# Patient Record
Sex: Female | Born: 1990 | Race: Black or African American | Hispanic: No | Marital: Single | Smoking: Former smoker
Health system: Southern US, Community
[De-identification: ages and names within clinical notes are randomized; demographics above are authoritative.]

## PROBLEM LIST (undated history)

## (undated) ENCOUNTER — Inpatient Hospital Stay (HOSPITAL_COMMUNITY): Payer: Self-pay

## (undated) DIAGNOSIS — R87629 Unspecified abnormal cytological findings in specimens from vagina: Secondary | ICD-10-CM

## (undated) DIAGNOSIS — IMO0002 Reserved for concepts with insufficient information to code with codable children: Secondary | ICD-10-CM

## (undated) DIAGNOSIS — L089 Local infection of the skin and subcutaneous tissue, unspecified: Secondary | ICD-10-CM

## (undated) HISTORY — DX: Unspecified abnormal cytological findings in specimens from vagina: R87.629

---

## 2008-02-24 DIAGNOSIS — IMO0002 Reserved for concepts with insufficient information to code with codable children: Secondary | ICD-10-CM

## 2008-02-24 DIAGNOSIS — R87619 Unspecified abnormal cytological findings in specimens from cervix uteri: Secondary | ICD-10-CM

## 2008-02-24 HISTORY — DX: Reserved for concepts with insufficient information to code with codable children: IMO0002

## 2008-02-24 HISTORY — DX: Unspecified abnormal cytological findings in specimens from cervix uteri: R87.619

## 2010-06-26 ENCOUNTER — Emergency Department (HOSPITAL_COMMUNITY)
Admission: EM | Admit: 2010-06-26 | Discharge: 2010-06-26 | Disposition: A | Discharge status: Home / Self Care | Payer: Self-pay | Attending: Emergency Medicine | Admitting: Emergency Medicine

## 2010-06-26 DIAGNOSIS — B86 Scabies: Secondary | ICD-10-CM | POA: Insufficient documentation

## 2010-06-26 DIAGNOSIS — R21 Rash and other nonspecific skin eruption: Secondary | ICD-10-CM | POA: Insufficient documentation

## 2011-01-01 ENCOUNTER — Emergency Department (INDEPENDENT_AMBULATORY_CARE_PROVIDER_SITE_OTHER)
Admission: EM | Admit: 2011-01-01 | Discharge: 2011-01-01 | Disposition: A | Discharge status: Home / Self Care | Payer: Self-pay | Source: Home / Self Care | Attending: Family Medicine | Admitting: Family Medicine

## 2011-01-01 ENCOUNTER — Encounter: Payer: Self-pay | Admitting: Emergency Medicine

## 2011-01-01 DIAGNOSIS — J069 Acute upper respiratory infection, unspecified: Secondary | ICD-10-CM

## 2011-01-01 DIAGNOSIS — R05 Cough: Secondary | ICD-10-CM

## 2011-01-01 MED ORDER — GUAIFENESIN-CODEINE 100-10 MG/5ML PO SYRP: 5.0000 mL | ORAL_SOLUTION | Freq: Three times a day (TID) | ORAL | Status: AC | PRN

## 2011-01-01 MED ORDER — AZITHROMYCIN 250 MG PO TABS: 250.0000 mg | ORAL_TABLET | Freq: Every day | ORAL | Status: AC

## 2011-01-01 NOTE — ED Provider Notes (Signed)
History     CSN: [id number] Arrival date & time: 01/01/2011  8:58 AM   First MD Initiated Contact with Patient 01/01/11 1006      Chief Complaint  Patient presents with  . Cough    (Consider location/radiation/quality/duration/timing/severity/associated sxs/prior treatment) Patient is a 20 y.o. female presenting with cough. The history is provided by the patient.  Cough This is a new problem. The current episode started more than 1 week ago. The problem occurs constantly. The cough is productive of sputum. There has been no fever. Associated symptoms include headaches and rhinorrhea. Pertinent negatives include no chills, no sweats, no ear congestion, no ear pain, no sore throat, no myalgias, no shortness of breath and no wheezing. She has tried decongestants for the symptoms. She is not a smoker.    History reviewed. No pertinent past medical history.  History reviewed. No pertinent past surgical history.  History reviewed. No pertinent family history.  History  Substance Use Topics  . Smoking status: Former Smoker    Quit date: 12/25/2010  . Smokeless tobacco: Not on file  . Alcohol Use: Yes    OB History    Grav Para Term Preterm Abortions TAB SAB Ect Mult Living                  Review of Systems  Constitutional: Positive for fatigue. Negative for fever and chills.  HENT: Positive for congestion, rhinorrhea and neck pain. Negative for ear pain, nosebleeds, sore throat and neck stiffness.   Eyes: Negative.   Respiratory: Positive for cough. Negative for shortness of breath and wheezing.   Cardiovascular: Negative.   Gastrointestinal: Negative.   Genitourinary: Negative.   Musculoskeletal: Negative for myalgias.  Skin: Negative.   Neurological: Positive for headaches.    Allergies  Review of patient's allergies indicates no known allergies.  Home Medications   Current Outpatient Rx  Name Route Sig Dispense Refill  . TYLENOL COLD/FLU SEVERE PO Oral Take by  mouth.      . NYQUIL PO Oral Take by mouth.        BP 114/78  Pulse 74  Temp(Src) 98.5 F (36.9 C) (Oral)  Resp 16  SpO2 99%  LMP 12/09/2010  Physical Exam  Constitutional: She appears well-developed and well-nourished.  HENT:  Head: Normocephalic and atraumatic.  Right Ear: Tympanic membrane normal.  Left Ear: Tympanic membrane normal.  Nose: Nose normal.  Mouth/Throat: Uvula is midline and mucous membranes are normal. No oropharyngeal exudate or posterior oropharyngeal erythema.  Cardiovascular: Normal rate and regular rhythm.   Pulmonary/Chest: Effort normal and breath sounds normal.    ED Course  Procedures (including critical care time)  Labs Reviewed - No data to display No results found.   No diagnosis found.    MDM          Richardo Priest, MD 01/01/11 1026

## 2011-01-01 NOTE — ED Notes (Signed)
Cough and congestion for more than a week.  Feels congestion in throat and chest with cough, denies fever.  Phlegm is white.  Difficult to cough phlegm out

## 2011-03-20 ENCOUNTER — Inpatient Hospital Stay (HOSPITAL_COMMUNITY)
Admission: AD | Admit: 2011-03-20 | Discharge: 2011-03-20 | Disposition: A | Discharge status: Home / Self Care | Payer: Self-pay | Source: Ambulatory Visit | Attending: Family Medicine | Admitting: Family Medicine

## 2011-03-20 ENCOUNTER — Emergency Department (HOSPITAL_COMMUNITY)
Admission: EM | Admit: 2011-03-20 | Discharge: 2011-03-20 | Disposition: A | Discharge status: Home / Self Care | Payer: Self-pay | Attending: Emergency Medicine | Admitting: Emergency Medicine

## 2011-03-20 ENCOUNTER — Encounter (HOSPITAL_COMMUNITY): Payer: Self-pay | Admitting: *Deleted

## 2011-03-20 ENCOUNTER — Encounter (HOSPITAL_COMMUNITY): Payer: Self-pay

## 2011-03-20 DIAGNOSIS — L02219 Cutaneous abscess of trunk, unspecified: Secondary | ICD-10-CM | POA: Insufficient documentation

## 2011-03-20 DIAGNOSIS — R10819 Abdominal tenderness, unspecified site: Secondary | ICD-10-CM | POA: Insufficient documentation

## 2011-03-20 DIAGNOSIS — L02214 Cutaneous abscess of groin: Secondary | ICD-10-CM

## 2011-03-20 DIAGNOSIS — L02419 Cutaneous abscess of limb, unspecified: Secondary | ICD-10-CM | POA: Insufficient documentation

## 2011-03-20 DIAGNOSIS — L03319 Cellulitis of trunk, unspecified: Secondary | ICD-10-CM | POA: Insufficient documentation

## 2011-03-20 DIAGNOSIS — L0291 Cutaneous abscess, unspecified: Secondary | ICD-10-CM

## 2011-03-20 HISTORY — DX: Reserved for concepts with insufficient information to code with codable children: IMO0002

## 2011-03-20 LAB — CBC
MCH: 28.3 pg (ref 26.0–34.0)
MCHC: 32 g/dL (ref 30.0–36.0)
MCV: 88.4 fL (ref 78.0–100.0)
Platelets: 284 10*3/uL (ref 150–400)
RBC: 4.14 MIL/uL (ref 3.87–5.11)

## 2011-03-20 MED ORDER — DOXYCYCLINE HYCLATE 100 MG PO CAPS: 100.0000 mg | ORAL_CAPSULE | Freq: Two times a day (BID) | ORAL | Status: AC

## 2011-03-20 MED ORDER — IBUPROFEN 800 MG PO TABS: 800.0000 mg | ORAL_TABLET | Freq: Three times a day (TID) | ORAL | Status: AC

## 2011-03-20 MED ORDER — KETOROLAC TROMETHAMINE 60 MG/2ML IM SOLN
60.0000 mg | Freq: Once | INTRAMUSCULAR | Status: AC
  Administered 2011-03-20: 60 mg via INTRAMUSCULAR
  Filled 2011-03-20: qty 2

## 2011-03-20 MED ORDER — HYDROCODONE-ACETAMINOPHEN 5-325 MG PO TABS: 2.0000 | ORAL_TABLET | ORAL | Status: AC | PRN

## 2011-03-20 NOTE — ED Notes (Signed)
[facility] in [facility]. Pt states [facility] told her she had an [facility] and sent her to ed to have follow up

## 2011-03-20 NOTE — Progress Notes (Signed)
Patient states she shaved last week and on 1-23 started having pain and soreness on the right thigh and in the groin area. Concerned about an infection.

## 2011-03-20 NOTE — Consult Note (Signed)
Consulted with Dr. Weldon Inches regarding pt HPI/exam>lesion needs I&D send to [facility]

## 2011-03-20 NOTE — ED Notes (Signed)
Pt states "I've been @ the [facility] since noon, they led me to believe everyone here was waiting on me, I think what caused it was I shaved"; pt indicates she has an abscess to right groin

## 2011-03-20 NOTE — ED Provider Notes (Signed)
Chart reviewed and agree with management and plan.  

## 2011-03-20 NOTE — ED Provider Notes (Signed)
History     No chief complaint on file.  HPI  Patient states she shaved last week and on 1-23 started having pain and soreness on the right thigh and in the groin area. Concerned about an infection.  Denies fever, body aches, or chills.   Past Medical History  Diagnosis Date  . Abnormal Pap smear 2010    cervical biopsy    Past Surgical History  Procedure Date  . No past surgeries     Family History  Problem Relation Age of Onset  . Anesthesia problems Neg Hx     History  Substance Use Topics  . Smoking status: Former Smoker    Quit date: 12/25/2010  . Smokeless tobacco: Not on file  . Alcohol Use: Yes    Allergies: No Known Allergies  Prescriptions prior to admission  Medication Sig Dispense Refill  . acetaminophen (TYLENOL) 500 MG tablet Take 1,000 mg by mouth every 6 (six) hours as needed. Takes for pain        Review of Systems  Constitutional: Negative.   Skin: Positive for rash ( groin, right thign).   Physical Exam   Blood pressure 142/75, pulse 98, temperature 99.2 F (37.3 C), temperature source Oral, resp. rate 16, height 5\' 2"  (1.575 m), weight 98.793 kg (217 lb 12.8 oz), last menstrual period 03/17/2011, SpO2 99.00%.  Physical Exam  Constitutional: She is oriented to person, place, and time. She appears well-developed and well-nourished. No distress.  HENT:  Head: Normocephalic.  Neck: Normal range of motion. Neck supple.  Cardiovascular: Normal rate, regular rhythm and normal heart sounds.   Respiratory: Effort normal and breath sounds normal.  GI: Soft.  Lymphadenopathy:       Right: Inguinal adenopathy present.  Neurological: She is alert and oriented to person, place, and time. She has normal reflexes.  Skin: Skin is warm and dry.       MAU Course  Procedures IM Toradol Consult with Dr. Manus Gunning [facility])>send pt for I&D of wound Assessment and Plan  Abscess  Plan: Discharge from [facility]  Instruct pt to go to [facility]  for drainage of wound  [facility] 03/20/2011, 1:48 PM

## 2011-03-20 NOTE — ED Provider Notes (Signed)
History     CSN: [id number]  Arrival date & time 03/20/11  1533   First MD Initiated Contact with Patient 03/20/11 1553      Chief Complaint  Patient presents with  . Abscess    (Consider location/radiation/quality/duration/timing/severity/associated sxs/prior treatment) HPI Comments: Abscess: Patient presents for evaluation of a cutaneous abscess. Lesion is located in the right inguinal region. Onset was 2 days ago. Abscess has gradually become larger. No fevers/chills.  No drainage from the abscess.  Abscess is tender.  Patient does not have previous history of cutaneous abscesses. Patient does not have diabetes. Patient was seen at [facility] just prior to arrival and sent here for incision and drainage.   Patient is a 21 y.o. female presenting with abscess. The history is provided by the patient.  Abscess  This is a new problem. Episode onset: 2-3 days ago. The abscess is characterized by redness and painfulness. Pertinent negatives include no fever and no vomiting.    Past Medical History  Diagnosis Date  . Abnormal Pap smear 2010    cervical biopsy    Past Surgical History  Procedure Date  . No past surgeries     Family History  Problem Relation Age of Onset  . Anesthesia problems Neg Hx     History  Substance Use Topics  . Smoking status: Former Smoker    Quit date: 12/25/2010  . Smokeless tobacco: Not on file  . Alcohol Use: Yes    OB History    Grav Para Term Preterm Abortions TAB SAB Ect Mult Living   0 0 0 0 0 0 0 0 0 0       Review of Systems  Constitutional: Negative for fever and chills.  Gastrointestinal: Negative for vomiting.  Musculoskeletal: Negative for arthralgias.  Skin: Negative for wound.  Hematological: Does not bruise/bleed easily.    Allergies  Review of patient's allergies indicates no known allergies.  Home Medications   Current Outpatient Rx  Name Route Sig Dispense Refill  . ACETAMINOPHEN 500 MG PO TABS Oral Take  1,000 mg by mouth every 6 (six) hours as needed. Takes for pain      BP 136/84  Pulse 90  Temp(Src) 98.7 F (37.1 C) (Oral)  Resp 17  Wt 217 lb (98.431 kg)  SpO2 100%  LMP 03/17/2011  Physical Exam  Nursing note and vitals reviewed. Constitutional: She is oriented to person, place, and time. She appears well-developed and well-nourished. She does not have a sickly appearance. She does not appear ill. No distress.  HENT:  Head: Normocephalic and atraumatic.  Eyes: Conjunctivae and EOM are normal.  Neck: Normal range of motion. Neck supple.  Cardiovascular: Normal rate and regular rhythm.   Pulmonary/Chest: Effort normal and breath sounds normal.  Musculoskeletal: She exhibits no edema.  Lymphadenopathy:       Right: No inguinal adenopathy present.       Left: No inguinal adenopathy present.  Neurological: She is alert and oriented to person, place, and time.  Skin: Skin is warm and dry. No rash noted. She is not diaphoretic.       3 cm sized abscess located on right inguinal area Tenderness to palpation. Not currently draining. Abscess is fluctuant.  Mild surrounding erythema and induration.    ED Course  Procedures (including critical care time)  Labs Reviewed - No data to display No results found.   No diagnosis found.  INCISION AND DRAINAGE Performed by: Anne Shutter, Loistine Eberlin Consent: Verbal consent obtained.  Risks and benefits: risks, benefits and alternatives were discussed Type: abscess  Body area: right inguinal area  Anesthesia: local infiltration  Local anesthetic: lidocaine 2% with epinephrine  Anesthetic total: 2 ml  Complexity: complex Blunt dissection to break up loculations  Drainage: purulent  Drainage amount: large  Packing material: 1/4 in iodoform gauze  Patient tolerance: Patient tolerated the procedure well with no immediate complications.    MDM  Patient with skin abscess amenable to incision and drainage.  Abscess was large enough  to warrant packing with removal and wound recheck in 2 days.  Mild surrounding erythema and induration.  Will d/c to home.  Patient put on a course of Doxycycline and given short course of pain medication.        Pascal Lux [place], PA-C 03/20/11 2314

## 2011-03-20 NOTE — Consult Note (Signed)
  Medical screening examination/treatment/procedure(s) were performed by non-physician practitioner and as supervising physician I was immediately available for consultation/collaboration.     

## 2011-03-22 NOTE — ED Provider Notes (Signed)
Medical screening examination/treatment/procedure(s) were performed by non-physician practitioner and as supervising physician I was immediately available for consultation/collaboration.    Lucillie Kiesel L Justo Hengel, MD 03/22/11 2251 

## 2013-10-23 ENCOUNTER — Encounter (HOSPITAL_COMMUNITY): Payer: Self-pay | Admitting: Emergency Medicine

## 2013-10-23 ENCOUNTER — Emergency Department (HOSPITAL_COMMUNITY)
Admission: EM | Admit: 2013-10-23 | Discharge: 2013-10-23 | Disposition: A | Discharge status: Home / Self Care | Payer: Self-pay | Attending: Emergency Medicine | Admitting: Emergency Medicine

## 2013-10-23 DIAGNOSIS — J069 Acute upper respiratory infection, unspecified: Secondary | ICD-10-CM | POA: Insufficient documentation

## 2013-10-23 DIAGNOSIS — Z87891 Personal history of nicotine dependence: Secondary | ICD-10-CM | POA: Insufficient documentation

## 2013-10-23 DIAGNOSIS — J029 Acute pharyngitis, unspecified: Secondary | ICD-10-CM | POA: Insufficient documentation

## 2013-10-23 LAB — RAPID STREP SCREEN (MED CTR MEBANE ONLY): STREPTOCOCCUS, GROUP A SCREEN (DIRECT): NEGATIVE

## 2013-10-23 MED ORDER — BENZONATATE 100 MG PO CAPS: 200.0000 mg | ORAL_CAPSULE | Freq: Two times a day (BID) | ORAL | Status: DC | PRN

## 2013-10-23 NOTE — ED Notes (Signed)
Declined W/C at D/C and was escorted to lobby by RN. 

## 2013-10-23 NOTE — ED Notes (Signed)
Pt states "I don't even want my chest x-ray, I've been sitting here for over an hour and I just want whatever medications I can get and leave"

## 2013-10-23 NOTE — Discharge Instructions (Signed)
Return to the emergency room with worsening of symptoms, new symptoms or with symptoms that are concerning, difficulty breathing, coughing up thick colored material, or persistent symptoms.   Upper Respiratory Infection, Adult An upper respiratory infection (URI) is also sometimes known as the common cold. The upper respiratory tract includes the nose, sinuses, throat, trachea, and bronchi. Bronchi are the airways leading to the lungs. Most people improve within 1 week, but symptoms can last up to 2 weeks. A residual cough may last even longer.  CAUSES Many different viruses can infect the tissues lining the upper respiratory tract. The tissues become irritated and inflamed and often become very moist. Mucus production is also common. A cold is contagious. You can easily spread the virus to others by oral contact. This includes kissing, sharing a glass, coughing, or sneezing. Touching your mouth or nose and then touching a surface, which is then touched by another person, can also spread the virus. SYMPTOMS  Symptoms typically develop 1 to 3 days after you come in contact with a cold virus. Symptoms vary from person to person. They may include:  Runny nose.  Sneezing.  Nasal congestion.  Sinus irritation.  Sore throat.  Loss of voice (laryngitis).  Cough.  Fatigue.  Muscle aches.  Loss of appetite.  Headache.  Low-grade fever. DIAGNOSIS  You might diagnose your own cold based on familiar symptoms, since most people get a cold 2 to 3 times a year. Your caregiver can confirm this based on your exam. Most importantly, your caregiver can check that your symptoms are not due to another disease such as strep throat, sinusitis, pneumonia, asthma, or epiglottitis. Blood tests, throat tests, and X-rays are not necessary to diagnose a common cold, but they may sometimes be helpful in excluding other more serious diseases. Your caregiver will decide if any further tests are required. RISKS  AND COMPLICATIONS  You may be at risk for a more severe case of the common cold if you smoke cigarettes, have chronic heart disease (such as heart failure) or lung disease (such as asthma), or if you have a weakened immune system. The very young and very old are also at risk for more serious infections. Bacterial sinusitis, middle ear infections, and bacterial pneumonia can complicate the common cold. The common cold can worsen asthma and chronic obstructive pulmonary disease (COPD). Sometimes, these complications can require emergency medical care and may be life-threatening. PREVENTION  The best way to protect against getting a cold is to practice good hygiene. Avoid oral or hand contact with people with cold symptoms. Wash your hands often if contact occurs. There is no clear evidence that vitamin C, vitamin E, echinacea, or exercise reduces the chance of developing a cold. However, it is always recommended to get plenty of rest and practice good nutrition. TREATMENT  Treatment is directed at relieving symptoms. There is no cure. Antibiotics are not effective, because the infection is caused by a virus, not by bacteria. Treatment may include:  Increased fluid intake. Sports drinks offer valuable electrolytes, sugars, and fluids.  Breathing heated mist or steam (vaporizer or shower).  Eating chicken soup or other clear broths, and maintaining good nutrition.  Getting plenty of rest.  Using gargles or lozenges for comfort.  Controlling fevers with ibuprofen or acetaminophen as directed by your caregiver.  Increasing usage of your inhaler if you have asthma. Zinc gel and zinc lozenges, taken in the first 24 hours of the common cold, can shorten the duration and lessen the  severity of symptoms. Pain medicines may help with fever, muscle aches, and throat pain. A variety of non-prescription medicines are available to treat congestion and runny nose. Your caregiver can make recommendations and may  suggest nasal or lung inhalers for other symptoms.  HOME CARE INSTRUCTIONS   Only take over-the-counter or prescription medicines for pain, discomfort, or fever as directed by your caregiver.  Use a warm mist humidifier or inhale steam from a shower to increase air moisture. This may keep secretions moist and make it easier to breathe.  Drink enough water and fluids to keep your urine clear or pale yellow.  Rest as needed.  Return to work when your temperature has returned to normal or as your caregiver advises. You may need to stay home longer to avoid infecting others. You can also use a face mask and careful hand washing to prevent spread of the virus. SEEK MEDICAL CARE IF:   After the first few days, you feel you are getting worse rather than better.  You need your caregiver's advice about medicines to control symptoms.  You develop chills, worsening shortness of breath, or brown or red sputum. These may be signs of pneumonia.  You develop yellow or brown nasal discharge or pain in the face, especially when you bend forward. These may be signs of sinusitis.  You develop a fever, swollen neck glands, pain with swallowing, or white areas in the back of your throat. These may be signs of strep throat. SEEK IMMEDIATE MEDICAL CARE IF:   You have a fever.  You develop severe or persistent headache, ear pain, sinus pain, or chest pain.  You develop wheezing, a prolonged cough, cough up blood, or have a change in your usual mucus (if you have chronic lung disease).  You develop sore muscles or a stiff neck. Document Released: 08/05/2000 Document Revised: 05/04/2011 Document Reviewed: 05/17/2013 [facility] Patient Information 2015 [employer], [place]. This information is not intended to replace advice given to you by your health care provider. Make sure you discuss any questions you have with your health care provider.

## 2013-10-23 NOTE — ED Provider Notes (Signed)
CSN: [id number]     Arrival date & time 10/23/13  1712 History  This chart was scribed for non-physician practitioner, Louann Sjogren, PA-C working with Gerhard Munch, MD by Greggory Stallion, ED scribe. This patient was seen in room TR07C/TR07C and the patient's care was started at 6:01 PM.    Chief Complaint  Patient presents with  . Sore Throat  . Cough   The history is provided by the patient. No language interpreter was used.   HPI Comments: Rachel Barrera is a 23 y.o. female who presents to the Emergency Department complaining of gradual onset sore throat, cough and intermittent congestion, generalized body aches that started one week ago. She states the cough is productive of yellow/clear mucus that is nonopaque, nonthick in the mornings but becomes nonproductive throughout the day. Reports associated intermittent headaches due to cough. Pt has taken alka seltzer and mucinex with no relief. She has also used chloraseptic with little relief. States this is not similar to her allergies. Denies fever, chills, rhinorrhea, sinus pressure or tenderness, difficulty breathing, SOB. Pt states she started smoking again.   Past Medical History  Diagnosis Date  . Abnormal Pap smear 2010    cervical biopsy   Past Surgical History  Procedure Laterality Date  . No past surgeries     Family History  Problem Relation Age of Onset  . Anesthesia problems Neg Hx    History  Substance Use Topics  . Smoking status: Former Smoker    Quit date: 12/25/2010  . Smokeless tobacco: Not on file  . Alcohol Use: Yes   OB History   Grav Para Term Preterm Abortions TAB SAB Ect Mult Living       Review of Systems  Constitutional: Negative for fever and chills.  HENT: Positive for congestion and sore throat. Negative for rhinorrhea and sinus pressure.   Respiratory: Positive for cough. Negative for shortness of breath.   Neurological: Positive for headaches.  All other systems  reviewed and are negative.  Allergies  Review of patient's allergies indicates no known allergies.  Home Medications   Prior to Admission medications   Medication Sig Start Date End Date Taking? Authorizing Provider  acetaminophen (TYLENOL) 500 MG tablet Take 1,000 mg by mouth every 6 (six) hours as needed. Takes for pain   Yes Historical Provider, MD  benzonatate (TESSALON) 100 MG capsule Take 2 capsules (200 mg total) by mouth 2 (two) times daily as needed for cough. 10/23/13   Louann Sjogren, PA-C   BP 123/79  Pulse 77  Temp(Src) 98.4 F (36.9 C) (Oral)  Resp 12  Wt 206 lb 9 oz (93.696 kg)  SpO2 100%  LMP 10/15/2013  Physical Exam  Nursing note and vitals reviewed. Constitutional: She is oriented to person, place, and time. She appears well-developed and well-nourished. No distress.  HENT:  Head: Normocephalic and atraumatic.  Mouth/Throat: Posterior oropharyngeal erythema present. No oropharyngeal exudate or posterior oropharyngeal edema.  No sinus tenderness.  Eyes: Conjunctivae and EOM are normal.  Neck: Normal range of motion. Neck supple. No tracheal deviation present.  Cardiovascular: Normal rate, regular rhythm and normal heart sounds.   Pulmonary/Chest: Effort normal and breath sounds normal. No respiratory distress. She has no wheezes. She has no rhonchi. She has no rales.  Musculoskeletal: Normal range of motion.  Lymphadenopathy:    She has cervical adenopathy.  Neurological: She is alert and oriented to person, place, and time.  She exhibits normal muscle tone.  Skin: Skin is warm and dry.  Psychiatric: She has a normal mood and affect. Her behavior is normal.    ED Course  Procedures (including critical care time)  DIAGNOSTIC STUDIES: Oxygen Saturation is 99% on RA, normal by my interpretation.    COORDINATION OF CARE: 6:08 PM-Discussed smoking cessation with pt. Discussed treatment plan with pt at bedside and pt agreed to plan.   Labs Review Labs  Reviewed  RAPID STREP SCREEN  CULTURE, GROUP A STREP    Imaging Review No results found.   EKG Interpretation None     Meds given in ED:  Medications - No data to display  Discharge Medication List as of 10/23/2013  6:49 PM    START taking these medications   Details  benzonatate (TESSALON) 100 MG capsule Take 2 capsules (200 mg total) by mouth 2 (two) times daily as needed for cough., Starting 10/23/2013, Until Discontinued, Print          MDM   Final diagnoses:  URI (upper respiratory infection)   Rachel Barrera is a 23 y.o. female complaining of persistent cough, sore throat for one week. She has tried OTC cold meds with minimal relief. VSS. Lungs clear to auscultation and pharynx erythematous.  Rapid strep negative. Patient is a smoker. I doubt PNA due to clear BS, afebrile and nonpurulent sputum. Discussed the typical course of a URI and the prolonged cough that can last for up to two months. Discussed the effect of smoking on the lungs and how it prolongs the course of a URI. Discussed smoking cessation and patient seems agreeable to this. Continue to treat with OTC cold meds and prescribed tessalon. Patient advised to return with increase in sputum and purulence and fevers, difficulty breathing. Patient does not have PCP. Follow up with ED if symptoms worsen.  Discussed return precautions with patient. Discussed all results and patient verbalizes understanding and agrees with plan.  I personally performed the services described in this documentation, which was scribed in my presence. The recorded information has been reviewed and is accurate.  Louann Sjogren, PA-C 10/24/13 1158

## 2013-10-23 NOTE — ED Notes (Signed)
Pt reports sore throat and cough for 1 week. Denies fever. Has been taking OTC meds with no relief. Is a x 4. No respiratory difficulty.

## 2013-10-24 NOTE — ED Provider Notes (Signed)
  Medical screening examination/treatment/procedure(s) were performed by non-physician practitioner and as supervising physician I was immediately available for consultation/collaboration.   EKG Interpretation None         Christabel Camire, MD 10/24/13 1845 

## 2013-10-25 LAB — CULTURE, GROUP A STREP

## 2013-11-12 ENCOUNTER — Encounter (HOSPITAL_COMMUNITY): Payer: Self-pay | Admitting: Emergency Medicine

## 2013-11-12 ENCOUNTER — Emergency Department (HOSPITAL_COMMUNITY)
Admission: EM | Admit: 2013-11-12 | Discharge: 2013-11-12 | Disposition: A | Discharge status: Home / Self Care | Payer: Self-pay | Attending: Emergency Medicine | Admitting: Emergency Medicine

## 2013-11-12 DIAGNOSIS — F17200 Nicotine dependence, unspecified, uncomplicated: Secondary | ICD-10-CM | POA: Insufficient documentation

## 2013-11-12 DIAGNOSIS — N764 Abscess of vulva: Secondary | ICD-10-CM | POA: Insufficient documentation

## 2013-11-12 DIAGNOSIS — Z792 Long term (current) use of antibiotics: Secondary | ICD-10-CM | POA: Insufficient documentation

## 2013-11-12 MED ORDER — CLINDAMYCIN HCL 150 MG PO CAPS: 300.0000 mg | ORAL_CAPSULE | Freq: Four times a day (QID) | ORAL | Status: DC

## 2013-11-12 MED ORDER — SODIUM BICARBONATE 4 % IV SOLN
5.0000 mL | Freq: Once | INTRAVENOUS | Status: AC
  Administered 2013-11-12: 5 mL via SUBCUTANEOUS
  Filled 2013-11-12: qty 5

## 2013-11-12 MED ORDER — OXYCODONE-ACETAMINOPHEN 5-325 MG PO TABS
1.0000 | ORAL_TABLET | Freq: Once | ORAL | Status: AC
  Administered 2013-11-12: 1 via ORAL
  Filled 2013-11-12: qty 1

## 2013-11-12 MED ORDER — LIDOCAINE HCL 1 % IJ SOLN
5.0000 mL | Freq: Once | INTRAMUSCULAR | Status: AC
  Administered 2013-11-12: 5 mL
  Filled 2013-11-12: qty 20

## 2013-11-12 MED ORDER — OXYCODONE-ACETAMINOPHEN 5-325 MG PO TABS: 1.0000 | ORAL_TABLET | Freq: Four times a day (QID) | ORAL | Status: DC | PRN

## 2013-11-12 NOTE — ED Provider Notes (Signed)
CSN: [id number]     Arrival date & time 11/12/13  1135 History   First MD Initiated Contact with Patient 11/12/13 1218     Chief Complaint  Patient presents with  . Abscess    left labia     (Consider location/radiation/quality/duration/timing/severity/associated sxs/prior Treatment) Patient is a 23 y.o. female presenting with abscess. The history is provided by the patient.  Abscess Associated symptoms: no fever    patient's had left labial swelling for the last couple days. His been getting worse. No fevers. She has a history of same after shaving, but states she's not been shaving recently. No chills. No fevers. No vaginal bleeding or discharge.  Past Medical History  Diagnosis Date  . Abnormal Pap smear 2010    cervical biopsy   Past Surgical History  Procedure Laterality Date  . No past surgeries     Family History  Problem Relation Age of Onset  . Anesthesia problems Neg Hx    History  Substance Use Topics  . Smoking status: Current Some Day Smoker    Types: Cigarettes  . Smokeless tobacco: Not on file  . Alcohol Use: Yes   OB History   Grav Para Term Preterm Abortions TAB SAB Ect Mult Living       Review of Systems  Constitutional: Negative for fever and chills.  Gastrointestinal: Negative for abdominal pain.  Genitourinary: Negative for hematuria, vaginal bleeding, vaginal discharge, vaginal pain and pelvic pain.      Allergies  Review of patient's allergies indicates no known allergies.  Home Medications   Prior to Admission medications   Medication Sig Start Date End Date Taking? Authorizing Provider  clindamycin (CLEOCIN) 150 MG capsule Take 2 capsules (300 mg total) by mouth 4 (four) times daily. 11/12/13   Juliet Rude. Shonda Mandarino, MD  oxyCODONE-acetaminophen (PERCOCET/ROXICET) 5-325 MG per tablet Take 1-2 tablets by mouth every 6 (six) hours as needed for severe pain. 11/12/13   Juliet Rude. Deundra Furber, MD   BP 120/66  Pulse 93   Temp(Src) 98.1 F (36.7 C) (Oral)  Resp 18  SpO2 100%  LMP 10/13/2013 Physical Exam  Constitutional: She appears well-developed and well-nourished.  Cardiovascular: Normal rate and regular rhythm.   Pulmonary/Chest: Effort normal.  Abdominal: There is no tenderness.  Genitourinary:  Tender firm area of approximately 3 cm on left labia majora. There is fluctuance. No drainage    ED Course  Procedures (including critical care time) Labs Review Labs Reviewed - No data to display  Imaging Review No results found.   EKG Interpretation None     INCISION AND DRAINAGE Performed by: Billee Cashing. Consent: Verbal consent obtained. Risks and benefits: risks, benefits and alternatives were discussed Type: abscess  Body area: Left labia majora  Anesthesia: local infiltration  Incision was made with a scalpel.  Local anesthetic: lidocaine 1% without epinephrine with bicarbonate   Anesthetic total: 2 ml  Complexity: complex Blunt dissection to break up loculations  Drainage: purulent  Drainage amount: Moderate    Patient tolerance: Patient tolerated the procedure well with no immediate complications.   MDM   Final diagnoses:  Left genital labial abscess    Patient labial abscess. Continued firmness after drainage. We'll treat with antibiotics. Will followup for recheck in 2-3 days.    Juliet Rude. Rubin Payor, MD 11/12/13 1600

## 2013-11-12 NOTE — Discharge Instructions (Signed)
Abscess  An abscess is an infected area that contains a collection of pus and debris.It can occur in almost any part of the body. An abscess is also known as a furuncle or boil.  CAUSES   An abscess occurs when tissue gets infected. This can occur from blockage of oil or sweat glands, infection of hair follicles, or a minor injury to the skin. As the body tries to fight the infection, pus collects in the area and creates pressure under the skin. This pressure causes pain. People with weakened immune systems have difficulty fighting infections and get certain abscesses more often.   SYMPTOMS  Usually an abscess develops on the skin and becomes a painful mass that is red, warm, and tender. If the abscess forms under the skin, you may feel a moveable soft area under the skin. Some abscesses break open (rupture) on their own, but most will continue to get worse without care. The infection can spread deeper into the body and eventually into the bloodstream, causing you to feel ill.   DIAGNOSIS   Your caregiver will take your medical history and perform a physical exam. A sample of fluid may also be taken from the abscess to determine what is causing your infection.  TREATMENT   Your caregiver may prescribe antibiotic medicines to fight the infection. However, taking antibiotics alone usually does not cure an abscess. Your caregiver may need to make a small cut (incision) in the abscess to drain the pus. In some cases, gauze is packed into the abscess to reduce pain and to continue draining the area.  HOME CARE INSTRUCTIONS    Only take over-the-counter or prescription medicines for pain, discomfort, or fever as directed by your caregiver.   If you were prescribed antibiotics, take them as directed. Finish them even if you start to feel better.   If gauze is used, follow your caregiver's directions for changing the gauze.   To avoid spreading the infection:   Keep your draining abscess covered with a  bandage.   Wash your hands well.   Do not share personal care items, towels, or whirlpools with others.   Avoid skin contact with others.   Keep your skin and clothes clean around the abscess.   Keep all follow-up appointments as directed by your caregiver.  SEEK MEDICAL CARE IF:    You have increased pain, swelling, redness, fluid drainage, or bleeding.   You have muscle aches, chills, or a general ill feeling.   You have a fever.  MAKE SURE YOU:    Understand these instructions.   Will watch your condition.   Will get help right away if you are not doing well or get worse.  Document Released: 11/19/2004 Document Revised: 08/11/2011 Document Reviewed: 04/24/2011  ExitCare Patient Information 2015 [employer]. This information is not intended to replace advice given to you by your health care provider. Make sure you discuss any questions you have with your health care provider.  Incision and Drainage  Incision and drainage is a procedure in which a sac-like structure (cystic structure) is opened and drained. The area to be drained usually contains material such as pus, fluid, or blood.   LET YOUR CAREGIVER KNOW ABOUT:    Allergies to medicine.   Medicines taken, including vitamins, herbs, eyedrops, over-the-counter medicines, and creams.   Use of steroids (by mouth or creams).   Previous problems with anesthetics or numbing medicines.   History of bleeding problems or blood clots.     Previous surgery.   Other health problems, including diabetes and kidney problems.   Possibility of pregnancy, if this applies.  RISKS AND COMPLICATIONS   Pain.   Bleeding.   Scarring.   Infection.  BEFORE THE PROCEDURE   You may need to have an ultrasound or other imaging tests to see how large or deep your cystic structure is. Blood tests may also be used to determine if you have an infection or how severe the infection is. You may need to have a tetanus shot.  PROCEDURE   The affected area is cleaned with a  cleaning fluid. The cyst area will then be numbed with a medicine (local anesthetic). A small incision will be made in the cystic structure. A syringe or catheter may be used to drain the contents of the cystic structure, or the contents may be squeezed out. The area will then be flushed with a cleansing solution. After cleansing the area, it is often gently packed with a gauze or another wound dressing. Once it is packed, it will be covered with gauze and tape or some other type of wound dressing.  AFTER THE PROCEDURE    Often, you will be allowed to go home right after the procedure.   You may be given antibiotic medicine to prevent or heal an infection.   If the area was packed with gauze or some other wound dressing, you will likely need to come back in 1 to 2 days to get it removed.   The area should heal in about 14 days.  Document Released: 08/05/2000 Document Revised: 08/11/2011 Document Reviewed: 04/06/2011  Exit[employer] Patient Information 2015 [employer]. This information is not intended to replace advice given to you by your health care provider. Make sure you discuss any questions you have with your health care provider.

## 2013-11-12 NOTE — ED Notes (Signed)
Pt c/o left labia swollen and very sore since Friday when her period started.  Pt states that she had one before but was due to shaving but she hasnt shaved since.  Pt states that with warm compresses hasnt made a head to drain.

## 2014-02-23 NOTE — L&D Delivery Note (Signed)
Delivery Note At 2:19 AM a viable female was delivered via  (Presentation: ;  ).  APGAR: , ; weight  .   Placenta status: , .  Cord:  with the following complications: .  Cord pH: not done  Anesthesia: Epidural  Episiotomy:   Lacerations:   Suture Repair: 2.0 Est. Blood Loss (mL):    Mom to postpartum.  Baby to Couplet care / Skin to Skin.  Dalylah Ramey A 11/24/2014, 2:31 AM

## 2014-02-28 ENCOUNTER — Ambulatory Visit: Payer: Self-pay | Admitting: Family

## 2014-04-04 ENCOUNTER — Encounter: Payer: Self-pay | Admitting: *Deleted

## 2014-04-04 ENCOUNTER — Ambulatory Visit (INDEPENDENT_AMBULATORY_CARE_PROVIDER_SITE_OTHER): Payer: 59 | Admitting: *Deleted

## 2014-04-04 DIAGNOSIS — Z3200 Encounter for pregnancy test, result unknown: Secondary | ICD-10-CM

## 2014-04-04 DIAGNOSIS — Z3401 Encounter for supervision of normal first pregnancy, first trimester: Secondary | ICD-10-CM

## 2014-04-04 LAB — OB RESULTS CONSOLE GC/CHLAMYDIA
Chlamydia: NEGATIVE
GC PROBE AMP, GENITAL: NEGATIVE

## 2014-04-04 LAB — POCT PREGNANCY, URINE: Preg Test, Ur: POSITIVE — AB

## 2014-04-04 MED ORDER — PRENATAL VITAMINS PLUS 27-1 MG PO TABS: 1.0000 | ORAL_TABLET | Freq: Every day | ORAL | Status: DC

## 2014-04-04 NOTE — Addendum Note (Signed)
Addended by: Gerome ApleyZEYFANG, LINDA L on: 04/04/2014 03:36 PM   Modules accepted: Orders

## 2014-04-04 NOTE — Progress Notes (Signed)
Rachel Barrera is here for a pregnancy test which was positive. States she has regular periods and her LMP was about 02/17/14 , could have been 12/27 or 02/19/14.  Would like to get prenatal care here, so will get blood work today.

## 2014-04-05 LAB — PRENATAL PROFILE (SOLSTAS)
Antibody Screen: NEGATIVE
Basophils Absolute: 0 10*3/uL (ref 0.0–0.1)
Basophils Relative: 0 % (ref 0–1)
Eosinophils Absolute: 0 10*3/uL (ref 0.0–0.7)
Eosinophils Relative: 0 % (ref 0–5)
HCT: 39.3 % (ref 36.0–46.0)
HIV 1&2 Ab, 4th Generation: NONREACTIVE
Hemoglobin: 12.8 g/dL (ref 12.0–15.0)
Hepatitis B Surface Ag: NEGATIVE
Lymphocytes Relative: 16 % (ref 12–46)
Lymphs Abs: 1.8 10*3/uL (ref 0.7–4.0)
MCH: 28.8 pg (ref 26.0–34.0)
MCHC: 32.6 g/dL (ref 30.0–36.0)
MCV: 88.3 fL (ref 78.0–100.0)
MPV: 10.2 fL (ref 8.6–12.4)
Monocytes Absolute: 0.7 10*3/uL (ref 0.1–1.0)
Monocytes Relative: 6 % (ref 3–12)
Neutro Abs: 8.9 10*3/uL — ABNORMAL HIGH (ref 1.7–7.7)
Neutrophils Relative %: 78 % — ABNORMAL HIGH (ref 43–77)
Platelets: 264 10*3/uL (ref 150–400)
RBC: 4.45 MIL/uL (ref 3.87–5.11)
RDW: 14.8 % (ref 11.5–15.5)
Rh Type: POSITIVE
Rubella: 7.47 Index — ABNORMAL HIGH (ref <0.90)
WBC: 11.4 10*3/uL — ABNORMAL HIGH (ref 4.0–10.5)

## 2014-04-06 LAB — HEMOGLOBINOPATHY EVALUATION
Hemoglobin Other: 0 %
Hgb A2 Quant: 2.4 % (ref 2.2–3.2)
Hgb A: 97.3 % (ref 96.8–97.8)
Hgb F Quant: 0.3 % (ref 0.0–2.0)
Hgb S Quant: 0 %

## 2014-04-09 LAB — CANNABANOIDS (GC/LC/MS), URINE: THC-COOH (GC/LC/MS), ur confirm: 261 ng/mL — AB

## 2014-04-11 LAB — PRESCRIPTION MONITORING PROFILE (19 PANEL)
Amphetamine/Meth: NEGATIVE ng/mL
BARBITURATE SCREEN, URINE: NEGATIVE ng/mL
Benzodiazepine Screen, Urine: NEGATIVE ng/mL
Buprenorphine, Urine: NEGATIVE ng/mL
COCAINE METABOLITES: NEGATIVE ng/mL
CREATININE, URINE: 610.92 mg/dL (ref >20.0)
Carisoprodol, Urine: NEGATIVE ng/mL
FENTANYL URINE: NEGATIVE ng/mL
MDMA URINE: NEGATIVE ng/mL
MEPERIDINE UR: NEGATIVE ng/mL
Methadone Screen, Urine: NEGATIVE ng/mL
Methaqualone: NEGATIVE ng/mL
NITRITES URINE, INITIAL: NEGATIVE ug/mL
OPIATE SCREEN, URINE: NEGATIVE ng/mL
OXYCODONE SCRN UR: NEGATIVE ng/mL
PH URINE, INITIAL: 6 pH (ref 4.5–8.9)
PROPOXYPHENE: NEGATIVE ng/mL
Phencyclidine, Ur: NEGATIVE ng/mL
TRAMADOL UR: NEGATIVE ng/mL
Tapentadol, urine: NEGATIVE ng/mL
Zolpidem, Urine: NEGATIVE ng/mL

## 2014-05-03 ENCOUNTER — Other Ambulatory Visit: Payer: Self-pay | Admitting: Obstetrics & Gynecology

## 2014-05-03 ENCOUNTER — Other Ambulatory Visit (HOSPITAL_COMMUNITY)
Admission: RE | Admit: 2014-05-03 | Discharge: 2014-05-03 | Disposition: A | Discharge status: Home / Self Care | Payer: Medicaid Other | Source: Ambulatory Visit | Attending: Obstetrics & Gynecology | Admitting: Obstetrics & Gynecology

## 2014-05-03 ENCOUNTER — Encounter: Payer: Self-pay | Admitting: Obstetrics & Gynecology

## 2014-05-03 ENCOUNTER — Ambulatory Visit (INDEPENDENT_AMBULATORY_CARE_PROVIDER_SITE_OTHER): Payer: Medicaid Other | Admitting: Obstetrics & Gynecology

## 2014-05-03 VITALS — BP 114/61 | HR 83 | Temp 98.9°F | Ht 63.5 in | Wt 209.2 lb

## 2014-05-03 DIAGNOSIS — Z3682 Encounter for antenatal screening for nuchal translucency: Secondary | ICD-10-CM

## 2014-05-03 DIAGNOSIS — Z01419 Encounter for gynecological examination (general) (routine) without abnormal findings: Secondary | ICD-10-CM | POA: Diagnosis not present

## 2014-05-03 DIAGNOSIS — Z113 Encounter for screening for infections with a predominantly sexual mode of transmission: Secondary | ICD-10-CM | POA: Insufficient documentation

## 2014-05-03 DIAGNOSIS — Z3401 Encounter for supervision of normal first pregnancy, first trimester: Secondary | ICD-10-CM

## 2014-05-03 DIAGNOSIS — O99211 Obesity complicating pregnancy, first trimester: Secondary | ICD-10-CM | POA: Insufficient documentation

## 2014-05-03 DIAGNOSIS — Z3400 Encounter for supervision of normal first pregnancy, unspecified trimester: Secondary | ICD-10-CM | POA: Insufficient documentation

## 2014-05-03 DIAGNOSIS — E669 Obesity, unspecified: Secondary | ICD-10-CM

## 2014-05-03 LAB — POCT URINALYSIS DIP (DEVICE)
BILIRUBIN URINE: NEGATIVE
Glucose, UA: NEGATIVE mg/dL
HGB URINE DIPSTICK: NEGATIVE
Ketones, ur: NEGATIVE mg/dL
Nitrite: NEGATIVE
PH: 7.5 (ref 5.0–8.0)
PROTEIN: NEGATIVE mg/dL
Specific Gravity, Urine: 1.02 (ref 1.005–1.030)
Urobilinogen, UA: 0.2 mg/dL (ref 0.0–1.0)

## 2014-05-03 MED ORDER — DOCUSATE SODIUM 100 MG PO CAPS: 100.0000 mg | ORAL_CAPSULE | Freq: Two times a day (BID) | ORAL | Status: DC | PRN

## 2014-05-03 NOTE — Progress Notes (Signed)
Here for initial prenatal visit.  States was drinking alcohol until found out was pregnant.

## 2014-05-03 NOTE — Progress Notes (Signed)
Nutrition note: 1st visit consult Pt was obese prior to pregnancy. Pt has gained 19.2 lb. @ 3836w5d, which is > expected. Pt reports eating 2 meals & 1 snack/d. Pt reports drinking a lot of juice & sugar-sweetened beverages. Pt is taking a PNV.  Pt reports no N&V or heartburn. Pt received verbal & written education on general nutrition during pregnancy. Discussed importance & benefits of BF. Discussed wt gain goals of 11-20 lb. or 0.5 lb./wk in 2nd & 3rd trimester. Pt agrees to monitor portion sizes & sugar-sweetened beverages. Pt does not have WIC but plans to apply. Pt is unsure about BF but knows it is good for the baby. F/u as needed Rachel RevealLaura Aidynn Krenn, MS, RD, LDN, [facility] Hospital[facility]CLC

## 2014-05-03 NOTE — Patient Instructions (Signed)
First Trimester of Pregnancy The first trimester of pregnancy is from week 1 until the end of week 12 (months 1 through 3). A week after a sperm fertilizes an egg, the egg will implant on the wall of the uterus. This embryo will begin to develop into a baby. Genes from you and your partner are forming the baby. The female genes determine whether the baby is a boy or a girl. At 6-8 weeks, the eyes and face are formed, and the heartbeat can be seen on ultrasound. At the end of 12 weeks, all the baby's organs are formed.  Now that you are pregnant, you will want to do everything you can to have a healthy baby. Two of the most important things are to get good prenatal care and to follow your health care provider's instructions. Prenatal care is all the medical care you receive before the baby's birth. This care will help prevent, find, and treat any problems during the pregnancy and childbirth. BODY CHANGES Your body goes through many changes during pregnancy. The changes vary from woman to woman.   You may gain or lose a couple of pounds at first.  You may feel sick to your stomach (nauseous) and throw up (vomit). If the vomiting is uncontrollable, call your health care provider.  You may tire easily.  You may develop headaches that can be relieved by medicines approved by your health care provider.  You may urinate more often. Painful urination may mean you have a bladder infection.  You may develop heartburn as a result of your pregnancy.  You may develop constipation because certain hormones are causing the muscles that push waste through your intestines to slow down.  You may develop hemorrhoids or swollen, bulging veins (varicose veins).  Your breasts may begin to grow larger and become tender. Your nipples may stick out more, and the tissue that surrounds them (areola) may become darker.  Your gums may bleed and may be sensitive to brushing and flossing.  Dark spots or blotches (chloasma,  mask of pregnancy) may develop on your face. This will likely fade after the baby is born.  Your menstrual periods will stop.  You may have a loss of appetite.  You may develop cravings for certain kinds of food.  You may have changes in your emotions from day to day, such as being excited to be pregnant or being concerned that something may go wrong with the pregnancy and baby.  You may have more vivid and strange dreams.  You may have changes in your hair. These can include thickening of your hair, rapid growth, and changes in texture. Some women also have hair loss during or after pregnancy, or hair that feels dry or thin. Your hair will most likely return to normal after your baby is born. WHAT TO EXPECT AT YOUR PRENATAL VISITS During a routine prenatal visit:  You will be weighed to make sure you and the baby are growing normally.  Your blood pressure will be taken.  Your abdomen will be measured to track your baby's growth.  The fetal heartbeat will be listened to starting around week 10 or 12 of your pregnancy.  Test results from any previous visits will be discussed. Your health care provider may ask you:  How you are feeling.  If you are feeling the baby move.  If you have had any abnormal symptoms, such as leaking fluid, bleeding, severe headaches, or abdominal cramping.  If you have any questions. Other tests   that may be performed during your first trimester include:  Blood tests to find your blood type and to check for the presence of any previous infections. They will also be used to check for low iron levels (anemia) and Rh antibodies. Later in the pregnancy, blood tests for diabetes will be done along with other tests if problems develop.  Urine tests to check for infections, diabetes, or protein in the urine.  An ultrasound to confirm the proper growth and development of the baby.  An amniocentesis to check for possible genetic problems.  Fetal screens for  spina bifida and Down syndrome.  You may need other tests to make sure you and the baby are doing well. HOME CARE INSTRUCTIONS  Medicines  Follow your health care provider's instructions regarding medicine use. Specific medicines may be either safe or unsafe to take during pregnancy.  Take your prenatal vitamins as directed.  If you develop constipation, try taking a stool softener if your health care provider approves. Diet  Eat regular, well-balanced meals. Choose a variety of foods, such as meat or vegetable-based protein, fish, milk and low-fat dairy products, vegetables, fruits, and whole grain breads and cereals. Your health care provider will help you determine the amount of weight gain that is right for you.  Avoid raw meat and uncooked cheese. These carry germs that can cause birth defects in the baby.  Eating four or five small meals rather than three large meals a day may help relieve nausea and vomiting. If you start to feel nauseous, eating a few soda crackers can be helpful. Drinking liquids between meals instead of during meals also seems to help nausea and vomiting.  If you develop constipation, eat more high-fiber foods, such as fresh vegetables or fruit and whole grains. Drink enough fluids to keep your urine clear or pale yellow. Activity and Exercise  Exercise only as directed by your health care provider. Exercising will help you:  Control your weight.  Stay in shape.  Be prepared for labor and delivery.  Experiencing pain or cramping in the lower abdomen or low back is a good sign that you should stop exercising. Check with your health care provider before continuing normal exercises.  Try to avoid standing for long periods of time. Move your legs often if you must stand in one place for a long time.  Avoid heavy lifting.  Wear low-heeled shoes, and practice good posture.  You may continue to have sex unless your health care provider directs you  otherwise. Relief of Pain or Discomfort  Wear a good support bra for breast tenderness.   Take warm sitz baths to soothe any pain or discomfort caused by hemorrhoids. Use hemorrhoid cream if your health care provider approves.   Rest with your legs elevated if you have leg cramps or low back pain.  If you develop varicose veins in your legs, wear support hose. Elevate your feet for 15 minutes, 3-4 times a day. Limit salt in your diet. Prenatal Care  Schedule your prenatal visits by the twelfth week of pregnancy. They are usually scheduled monthly at first, then more often in the last 2 months before delivery.  Write down your questions. Take them to your prenatal visits.  Keep all your prenatal visits as directed by your health care provider. Safety  Wear your seat belt at all times when driving.  Make a list of emergency phone numbers, including numbers for family, friends, the hospital, and police and fire departments. General Tips    Ask your health care provider for a referral to a local prenatal education class. Begin classes no later than at the beginning of month 6 of your pregnancy.  Ask for help if you have counseling or nutritional needs during pregnancy. Your health care provider can offer advice or refer you to specialists for help with various needs.  Do not use hot tubs, steam rooms, or saunas.  Do not douche or use tampons or scented sanitary pads.  Do not cross your legs for long periods of time.  Avoid cat litter boxes and soil used by cats. These carry germs that can cause birth defects in the baby and possibly loss of the fetus by miscarriage or stillbirth.  Avoid all smoking, herbs, alcohol, and medicines not prescribed by your health care provider. Chemicals in these affect the formation and growth of the baby.  Schedule a dentist appointment. At home, brush your teeth with a soft toothbrush and be gentle when you floss. SEEK MEDICAL CARE IF:   You have  dizziness.  You have mild pelvic cramps, pelvic pressure, or nagging pain in the abdominal area.  You have persistent nausea, vomiting, or diarrhea.  You have a bad smelling vaginal discharge.  You have pain with urination.  You notice increased swelling in your face, hands, legs, or ankles. SEEK IMMEDIATE MEDICAL CARE IF:   You have a fever.  You are leaking fluid from your vagina.  You have spotting or bleeding from your vagina.  You have severe abdominal cramping or pain.  You have rapid weight gain or loss.  You vomit blood or material that looks like coffee grounds.  You are exposed to German measles and have never had them.  You are exposed to fifth disease or chickenpox.  You develop a severe headache.  You have shortness of breath.  You have any kind of trauma, such as from a fall or a car accident. Document Released: 02/03/2001 Document Revised: 06/26/2013 Document Reviewed: 12/20/2012 Exit[employer] Patient Information 2015 [employer]. This information is not intended to replace advice given to you by your health care provider. Make sure you discuss any questions you have with your health care provider.  

## 2014-05-03 NOTE — Progress Notes (Signed)
First Trimester Screen 05/16/14 @ 4p with [facility].

## 2014-05-04 LAB — GLUCOSE TOLERANCE, 1 HOUR (50G) W/O FASTING: GLUCOSE 1 HOUR GTT: 120 mg/dL (ref 70–140)

## 2014-05-04 LAB — CYTOLOGY - PAP

## 2014-05-04 NOTE — Progress Notes (Signed)
New OB visit, first screen scheduled.  Subjective: new OB    Rachel Barrera is a G1P0000 5125w6d being seen today for her first obstetrical visit.  Her obstetrical history is significant for obesity. Patient does intend to breast feed. Pregnancy history fully reviewed.  Patient reports no complaints.  Filed Vitals:   05/03/14 1027 05/03/14 1125  BP: 114/61   Pulse: 83   Temp: 98.9 F (37.2 C)   Height:  5' 3.5" (1.613 m)  Weight: 209 lb 3.2 oz (94.892 kg)     HISTORY: OB History  Gravida Para Term Preterm AB SAB TAB Ectopic Multiple Living  1 0 0 0 0 0 0 0 0 0     # Outcome Date GA Lbr Len/2nd Weight Sex Delivery Anes PTL Lv  1 Current              Past Medical History  Diagnosis Date  . Abnormal Pap smear 2010    cervical biopsy  . Vaginal Pap smear, abnormal    Past Surgical History  Procedure Laterality Date  . No past surgeries     Family History  Problem Relation Age of Onset  . Anesthesia problems Neg Hx   . Hypertension Mother      Exam    Uterus:  Fundal Height: 11 cm  Pelvic Exam:    Perineum: No Hemorrhoids   Vulva: normal   Vagina:  normal mucosa   pH:     Cervix: no lesions   Adnexa: normal adnexa   Bony Pelvis: average  System: Breast:  normal appearance, no masses or tenderness   Skin: normal coloration and turgor, no rashes    Neurologic: oriented, normal mood   Extremities: normal strength, tone, and muscle mass   HEENT sclera clear, anicteric   Mouth/Teeth dental hygiene good   Neck supple   Cardiovascular: regular rate and rhythm   Respiratory:  appears well, vitals normal, no respiratory distress, acyanotic, normal RR   Abdomen: soft, non-tender; bowel sounds normal; no masses,  no organomegaly   Urinary: urethral meatus normal      Assessment:    Pregnancy: G1P0000 Patient Active Problem List   Diagnosis Date Noted  . Supervision of low-risk first pregnancy 05/03/2014  . Obesity affecting pregnancy in first trimester,  antepartum 05/03/2014        Plan:     Initial labs drawn. Prenatal vitamins. Problem list reviewed and updated. Genetic Screening discussed First Screen: ordered.  Ultrasound discussed; fetal survey: 18-20 weeks.  Follow up in 4 weeks. 50% of 30 min visit spent on counseling and coordination of care.     ARNOLD,JAMES 05/04/2014

## 2014-05-06 LAB — CULTURE, OB URINE

## 2014-05-08 ENCOUNTER — Telehealth: Payer: Self-pay

## 2014-05-08 ENCOUNTER — Encounter: Payer: Self-pay | Admitting: Family Medicine

## 2014-05-08 DIAGNOSIS — N390 Urinary tract infection, site not specified: Secondary | ICD-10-CM

## 2014-05-08 DIAGNOSIS — F1290 Cannabis use, unspecified, uncomplicated: Secondary | ICD-10-CM | POA: Insufficient documentation

## 2014-05-08 MED ORDER — NITROFURANTOIN MONOHYD MACRO 100 MG PO CAPS: 100.0000 mg | ORAL_CAPSULE | Freq: Two times a day (BID) | ORAL | Status: DC

## 2014-05-08 NOTE — Telephone Encounter (Signed)
-----   Message from Adam PhenixJames G Arnold, MD sent at 05/08/2014  9:30 AM EDT ----- UTI, Rx macrobid 100 mg BID 7 days

## 2014-05-08 NOTE — Telephone Encounter (Signed)
Macrobid 100mg  BID X 7 days e-prescribed to patient's pharmacy. Called patient and informed her of results and RX at pharmacy-- advised she take medication in its entirety. Patient verbalized understanding and asked about her other lab results. Informed her of normal pap and 1hr gtt. Patient verbalized understanding and gratitude. No further questions or concerns.

## 2014-05-16 ENCOUNTER — Ambulatory Visit (HOSPITAL_COMMUNITY)
Admission: RE | Admit: 2014-05-16 | Discharge: 2014-05-16 | Disposition: A | Discharge status: Home / Self Care | Payer: Medicaid Other | Source: Ambulatory Visit | Attending: Obstetrics & Gynecology | Admitting: Obstetrics & Gynecology

## 2014-05-16 ENCOUNTER — Encounter (HOSPITAL_COMMUNITY): Payer: Self-pay

## 2014-05-16 DIAGNOSIS — Z3682 Encounter for antenatal screening for nuchal translucency: Secondary | ICD-10-CM | POA: Insufficient documentation

## 2014-05-16 DIAGNOSIS — Z3A12 12 weeks gestation of pregnancy: Secondary | ICD-10-CM | POA: Insufficient documentation

## 2014-05-16 DIAGNOSIS — Z36 Encounter for antenatal screening of mother: Secondary | ICD-10-CM | POA: Diagnosis not present

## 2014-05-21 ENCOUNTER — Other Ambulatory Visit (HOSPITAL_COMMUNITY): Payer: Self-pay | Admitting: Maternal and Fetal Medicine

## 2014-05-21 DIAGNOSIS — IMO0002 Reserved for concepts with insufficient information to code with codable children: Secondary | ICD-10-CM

## 2014-05-21 DIAGNOSIS — Z0489 Encounter for examination and observation for other specified reasons: Secondary | ICD-10-CM

## 2014-05-25 ENCOUNTER — Other Ambulatory Visit (HOSPITAL_COMMUNITY): Payer: Self-pay | Admitting: Obstetrics & Gynecology

## 2014-05-25 DIAGNOSIS — Z3401 Encounter for supervision of normal first pregnancy, first trimester: Secondary | ICD-10-CM

## 2014-05-30 ENCOUNTER — Encounter: Payer: Self-pay | Admitting: *Deleted

## 2014-05-31 ENCOUNTER — Encounter: Payer: Medicaid Other | Admitting: Obstetrics & Gynecology

## 2014-06-27 ENCOUNTER — Ambulatory Visit (HOSPITAL_COMMUNITY)
Admission: RE | Admit: 2014-06-27 | Discharge: 2014-06-27 | Disposition: A | Discharge status: Home / Self Care | Payer: Medicaid Other | Source: Ambulatory Visit | Attending: Maternal and Fetal Medicine | Admitting: Maternal and Fetal Medicine

## 2014-06-27 DIAGNOSIS — Z36 Encounter for antenatal screening of mother: Secondary | ICD-10-CM | POA: Insufficient documentation

## 2014-06-27 DIAGNOSIS — Z0489 Encounter for examination and observation for other specified reasons: Secondary | ICD-10-CM

## 2014-06-27 DIAGNOSIS — Z3689 Encounter for other specified antenatal screening: Secondary | ICD-10-CM | POA: Insufficient documentation

## 2014-06-27 DIAGNOSIS — IMO0002 Reserved for concepts with insufficient information to code with codable children: Secondary | ICD-10-CM

## 2014-06-27 DIAGNOSIS — O99212 Obesity complicating pregnancy, second trimester: Secondary | ICD-10-CM | POA: Insufficient documentation

## 2014-06-27 DIAGNOSIS — Z3A18 18 weeks gestation of pregnancy: Secondary | ICD-10-CM | POA: Insufficient documentation

## 2014-07-22 ENCOUNTER — Encounter (HOSPITAL_COMMUNITY): Payer: Self-pay | Admitting: *Deleted

## 2014-07-22 ENCOUNTER — Inpatient Hospital Stay (HOSPITAL_COMMUNITY)
Admission: AD | Admit: 2014-07-22 | Discharge: 2014-07-22 | Disposition: A | Discharge status: Home / Self Care | Payer: Medicaid Other | Source: Ambulatory Visit | Attending: Obstetrics | Admitting: Obstetrics

## 2014-07-22 DIAGNOSIS — Z8249 Family history of ischemic heart disease and other diseases of the circulatory system: Secondary | ICD-10-CM | POA: Insufficient documentation

## 2014-07-22 DIAGNOSIS — N949 Unspecified condition associated with female genital organs and menstrual cycle: Secondary | ICD-10-CM

## 2014-07-22 DIAGNOSIS — R102 Pelvic and perineal pain: Secondary | ICD-10-CM | POA: Insufficient documentation

## 2014-07-22 DIAGNOSIS — O9989 Other specified diseases and conditions complicating pregnancy, childbirth and the puerperium: Secondary | ICD-10-CM | POA: Diagnosis not present

## 2014-07-22 DIAGNOSIS — Z87891 Personal history of nicotine dependence: Secondary | ICD-10-CM | POA: Insufficient documentation

## 2014-07-22 DIAGNOSIS — R109 Unspecified abdominal pain: Secondary | ICD-10-CM | POA: Diagnosis present

## 2014-07-22 DIAGNOSIS — Z3A22 22 weeks gestation of pregnancy: Secondary | ICD-10-CM | POA: Diagnosis not present

## 2014-07-22 LAB — URINALYSIS, ROUTINE W REFLEX MICROSCOPIC
Bilirubin Urine: NEGATIVE
Glucose, UA: NEGATIVE mg/dL
Hgb urine dipstick: NEGATIVE
KETONES UR: NEGATIVE mg/dL
Leukocytes, UA: NEGATIVE
NITRITE: NEGATIVE
Protein, ur: NEGATIVE mg/dL
SPECIFIC GRAVITY, URINE: 1.02 (ref 1.005–1.030)
UROBILINOGEN UA: 0.2 mg/dL (ref 0.0–1.0)
pH: 6.5 (ref 5.0–8.0)

## 2014-07-22 NOTE — Discharge Instructions (Signed)

## 2014-07-22 NOTE — MAU Note (Signed)
Pt reports pain in L side of abdomen since yesterday. Pain is described as a constant dull ache and only on the L side. Pt reports pain to be worse when walking or rolling over in bed. She took 1 Tylenol #3 last night, with no relief. Pt denies bleeding, leaking or vaginal discharge.

## 2014-07-22 NOTE — MAU Provider Note (Signed)
History     CSN: [id number]  Arrival date and time: 07/22/14 1518   First Provider Initiated Contact with Patient 07/22/14 [phone]      Chief Complaint  Patient presents with  . Abdominal Pain   HPI Rachel Barrera 24 y.o. G1P0000  presents to MAU complaining of left sided abdominal pain.  She noticed it starting yesterday morning in her left side.  She rested and took a Tylenol last evening both without benefit.  She feels like the pain has moved down a bit today.  She denies nausea, vomiting, diarrhea, weakness, fever, vaginal bleeding, LOF, dysuria.  She has not yet felt fetal movements this pregnancy.   OB History    Gravida Para Term Preterm AB TAB SAB Ectopic Multiple Living        Past Medical History  Diagnosis Date  . Abnormal Pap smear 2010    cervical biopsy  . Vaginal Pap smear, abnormal     Past Surgical History  Procedure Laterality Date  . No past surgeries      Family History  Problem Relation Age of Onset  . Anesthesia problems Neg Hx   . Hypertension Mother     History  Substance Use Topics  . Smoking status: Former Smoker    Types: Cigarettes  . Smokeless tobacco: Never Used  . Alcohol Use: Yes     Comment: stopped when found out pregnant-was drinking 3-4 drinks per week    Allergies: No Known Allergies  Prescriptions prior to admission  Medication Sig Dispense Refill Last Dose  . Prenatal Vit-Fe Fumarate-FA (PRENATAL VITAMINS PLUS) 27-1 MG TABS Take 1 tablet by mouth daily. 30 tablet 11 07/21/2014 at Unknown time  . docusate sodium (COLACE) 100 MG capsule Take 1 capsule (100 mg total) by mouth 2 (two) times daily as needed. (Patient not taking: Reported on 07/22/2014) 30 capsule 2 Not Taking at Unknown time  . nitrofurantoin, macrocrystal-monohydrate, (MACROBID) 100 MG capsule Take 1 capsule (100 mg total) by mouth 2 (two) times daily. (Patient not taking: Reported on 07/22/2014) 14 capsule 0 Completed Course at Unknown time     ROS Pertinent ROS in HPI.  All other systems are negative.   Physical Exam   Temperature 98 F (36.7 C), temperature source Oral, resp. rate 18, last menstrual period 02/17/2014.  Physical Exam  Constitutional: She is oriented to person, place, and time. She appears well-developed and well-nourished. No distress.  HENT:  Head: Normocephalic and atraumatic.  Eyes: EOM are normal.  Neck: Normal range of motion.  Cardiovascular: Normal rate, regular rhythm and normal heart sounds.   Respiratory: Effort normal and breath sounds normal. No respiratory distress.  GI: Soft. Bowel sounds are normal. She exhibits no distension.  Pt does have tenderness to deep palpation just medial to iliac crest on left   Musculoskeletal: Normal range of motion.  Neurological: She is alert and oriented to person, place, and time.  Skin: Skin is warm and dry.  Psychiatric: She has a normal mood and affect.   Results for orders placed or performed during the hospital encounter of 07/22/14 (from the past 24 hour(s))  Urinalysis, Routine w reflex microscopic (not at [facility])     Status: None   Collection Time: 07/22/14  3:25 PM  Result Value Ref Range   Color, Urine YELLOW YELLOW   APPearance CLEAR CLEAR   Specific Gravity, Urine 1.020 1.005 - 1.030   pH 6.5 5.0 -  8.0   Glucose, UA NEGATIVE NEGATIVE mg/dL   Hgb urine dipstick NEGATIVE NEGATIVE   Bilirubin Urine NEGATIVE NEGATIVE   Ketones, ur NEGATIVE NEGATIVE mg/dL   Protein, ur NEGATIVE NEGATIVE mg/dL   Urobilinogen, UA 0.2 0.0 - 1.0 mg/dL   Nitrite NEGATIVE NEGATIVE   Leukocytes, UA NEGATIVE NEGATIVE   MAU Course  Procedures  MDM Good fetal heart tones on triage No evidence for UTI  Assessment and Plan  A: Round ligament pain  P: Discharge to home Good po hydration encouraged Maternity support band F/u in clinic Patient may return to MAU as needed or if her condition were to change or worsen   Bertram Denvereague Clark, Ambre Kobayashi E 07/22/2014, 4:02  PM

## 2014-08-31 ENCOUNTER — Inpatient Hospital Stay (HOSPITAL_COMMUNITY)
Admission: AD | Admit: 2014-08-31 | Discharge: 2014-08-31 | Disposition: A | Discharge status: Home / Self Care | Payer: Medicaid Other | Source: Ambulatory Visit | Attending: Obstetrics | Admitting: Obstetrics

## 2014-08-31 ENCOUNTER — Encounter (HOSPITAL_COMMUNITY): Payer: Self-pay | Admitting: *Deleted

## 2014-08-31 DIAGNOSIS — O98813 Other maternal infectious and parasitic diseases complicating pregnancy, third trimester: Secondary | ICD-10-CM | POA: Diagnosis not present

## 2014-08-31 DIAGNOSIS — N390 Urinary tract infection, site not specified: Secondary | ICD-10-CM | POA: Diagnosis not present

## 2014-08-31 DIAGNOSIS — O2243 Hemorrhoids in pregnancy, third trimester: Secondary | ICD-10-CM | POA: Insufficient documentation

## 2014-08-31 DIAGNOSIS — B373 Candidiasis of vulva and vagina: Secondary | ICD-10-CM | POA: Diagnosis not present

## 2014-08-31 DIAGNOSIS — N898 Other specified noninflammatory disorders of vagina: Secondary | ICD-10-CM | POA: Diagnosis present

## 2014-08-31 DIAGNOSIS — B3731 Acute candidiasis of vulva and vagina: Secondary | ICD-10-CM

## 2014-08-31 DIAGNOSIS — Z3A28 28 weeks gestation of pregnancy: Secondary | ICD-10-CM | POA: Diagnosis not present

## 2014-08-31 DIAGNOSIS — Z87891 Personal history of nicotine dependence: Secondary | ICD-10-CM | POA: Diagnosis not present

## 2014-08-31 DIAGNOSIS — O2242 Hemorrhoids in pregnancy, second trimester: Secondary | ICD-10-CM

## 2014-08-31 LAB — URINALYSIS, ROUTINE W REFLEX MICROSCOPIC
BILIRUBIN URINE: NEGATIVE
Glucose, UA: NEGATIVE mg/dL
KETONES UR: 15 mg/dL — AB
Nitrite: NEGATIVE
PH: 7 (ref 5.0–8.0)
Protein, ur: NEGATIVE mg/dL
Specific Gravity, Urine: 1.025 (ref 1.005–1.030)
Urobilinogen, UA: 1 mg/dL (ref 0.0–1.0)

## 2014-08-31 LAB — URINE MICROSCOPIC-ADD ON

## 2014-08-31 MED ORDER — CEPHALEXIN 500 MG PO CAPS: 500.0000 mg | ORAL_CAPSULE | Freq: Four times a day (QID) | ORAL | Status: DC

## 2014-08-31 MED ORDER — TERCONAZOLE 0.4 % VA CREA: 1.0000 | TOPICAL_CREAM | Freq: Every day | VAGINAL | Status: DC

## 2014-08-31 MED ORDER — HYDROCORTISONE ACETATE 0.5 % EX CREA: TOPICAL_CREAM | Freq: Two times a day (BID) | CUTANEOUS | Status: DC

## 2014-08-31 NOTE — Discharge Instructions (Signed)
Candida Infection °A Candida infection (also called yeast, fungus, and Monilia infection) is an overgrowth of yeast that can occur anywhere on the body. A yeast infection commonly occurs in warm, moist body areas. Usually, the infection remains localized but can spread to become a systemic infection. A yeast infection may be a sign of a more severe disease such as diabetes, leukemia, or AIDS. °A yeast infection can occur in both men and women. In women, Candida vaginitis is a vaginal infection. It is one of the most common causes of vaginitis. Men usually do not have symptoms or know they have an infection until other problems develop. Men may find out they have a yeast infection because their sex partner has a yeast infection. Uncircumcised men are more likely to get a yeast infection than circumcised men. This is because the uncircumcised glans is not exposed to air and does not remain as dry as that of a circumcised glans. Older adults may develop yeast infections around dentures. °CAUSES  °Women °· Antibiotics. °· Steroid medication taken for a long time. °· Being overweight (obese). °· Diabetes. °· Poor immune condition. °· Certain serious medical conditions. °· Immune suppressive medications for organ transplant patients. °· Chemotherapy. °· Pregnancy. °· Menstruation. °· Stress and fatigue. °· Intravenous drug use. °· Oral contraceptives. °· Wearing tight-fitting clothes in the crotch area. °· Catching it from a sex partner who has a yeast infection. °· Spermicide. °· Intravenous, urinary, or other catheters. °Men °· Catching it from a sex partner who has a yeast infection. °· Having oral or anal sex with a person who has the infection. °· Spermicide. °· Diabetes. °· Antibiotics. °· Poor immune system. °· Medications that suppress the immune system. °· Intravenous drug use. °· Intravenous, urinary, or other catheters. °SYMPTOMS  °Women °· Thick, white vaginal discharge. °· Vaginal itching. °· Redness and  swelling in and around the vagina. °· Irritation of the lips of the vagina and perineum. °· Blisters on the vaginal lips and perineum. °· Painful sexual intercourse. °· Low blood sugar (hypoglycemia). °· Painful urination. °· Bladder infections. °· Intestinal problems such as constipation, indigestion, bad breath, bloating, increase in gas, diarrhea, or loose stools. °Men °· Men may develop intestinal problems such as constipation, indigestion, bad breath, bloating, increase in gas, diarrhea, or loose stools. °· Dry, cracked skin on the penis with itching or discomfort. °· Jock itch. °· Dry, flaky skin. °· Athlete's foot. °· Hypoglycemia. °DIAGNOSIS  °Women °· A history and an exam are performed. °· The discharge may be examined under a microscope. °· A culture may be taken of the discharge. °Men °· A history and an exam are performed. °· Any discharge from the penis or areas of cracked skin will be looked at under the microscope and cultured. °· Stool samples may be cultured. °TREATMENT  °Women °· Vaginal antifungal suppositories and creams. °· Medicated creams to decrease irritation and itching on the outside of the vagina. °· Warm compresses to the perineal area to decrease swelling and discomfort. °· Oral antifungal medications. °· Medicated vaginal suppositories or cream for repeated or recurrent infections. °· Wash and dry the irritation areas before applying the cream. °· Eating yogurt with Lactobacillus may help with prevention and treatment. °· Sometimes painting the vagina with gentian violet solution may help if creams and suppositories do not work. °Men °· Antifungal creams and oral antifungal medications. °· Sometimes treatment must continue for 30 days after the symptoms go away to prevent recurrence. °HOME CARE INSTRUCTIONS  °  Women °· Use cotton underwear and avoid tight-fitting clothing. °· Avoid colored, scented toilet paper and deodorant tampons or pads. °· Do not douche. °· Keep your diabetes  under control. °· Finish all the prescribed medications. °· Keep your skin clean and dry. °· Consume milk or yogurt with Lactobacillus-active culture regularly. If you get frequent yeast infections and think that is what the infection is, there are over-the-counter medications that you can get. If the infection does not show healing in 3 days, talk to your caregiver. °· Tell your sex partner you have a yeast infection. Your partner may need treatment also, especially if your infection does not clear up or recurs. °Men °· Keep your skin clean and dry. °· Keep your diabetes under control. °· Finish all prescribed medications. °· Tell your sex partner that you have a yeast infection so he or she can be treated if necessary. °SEEK MEDICAL CARE IF:  °· Your symptoms do not clear up or worsen in one week after treatment. °· You have an oral temperature above 102° F (38.9° C). °· You have trouble swallowing or eating for a prolonged time. °· You develop blisters on and around your vagina. °· You develop vaginal bleeding and it is not your menstrual period. °· You develop abdominal pain. °· You develop intestinal problems as mentioned above. °· You get weak or light-headed. °· You have painful or increased urination. °· You have pain during sexual intercourse. °MAKE SURE YOU:  °· Understand these instructions. °· Will watch your condition. °· Will get help right away if you are not doing well or get worse. °Document Released: 03/19/2004 Document Revised: 06/26/2013 Document Reviewed: 07/01/2009 °ExitCare® Patient Information ©2015 [employer]. This information is not intended to replace advice given to you by your health care provider. Make sure you discuss any questions you have with your health care provider. ° °Candidal Vulvovaginitis °Candidal vulvovaginitis is an infection of the vagina and vulva. The vulva is the skin around the opening of the vagina. This may cause itching and discomfort in and around the vagina.    °HOME CARE °· Only take medicine as told by your doctor. °· Do not have sex (intercourse) until the infection is healed or as told by your doctor. °· Practice safe sex. °· Tell your sex partner about your infection. °· Do not douche or use tampons. °· Wear cotton underwear. Do not wear tight pants or panty hose. °· Eat yogurt. This may help treat and prevent yeast infections. °GET HELP RIGHT AWAY IF:  °· You have a fever. °· Your problems get worse during treatment or do not get better in 3 days. °· You have discomfort, irritation, or itching in your vagina or vulva area. °· You have pain after sex. °· You start to get belly (abdominal) pain. °MAKE SURE YOU: °· Understand these instructions. °· Will watch your condition. °· Will get help right away if you are not doing well or get worse. °Document Released: 05/08/2008 Document Revised: 02/14/2013 Document Reviewed: 05/08/2008 °Exit[employer]® Patient Information ©2015 [employer]. This information is not intended to replace advice given to you by your health care provider. Make sure you discuss any questions you have with your health care provider. ° °

## 2014-08-31 NOTE — MAU Provider Note (Signed)
History     CSN: [id number]  Arrival date and time: 08/31/14 1435   First Provider Initiated Contact with Patient 08/31/14 1537      No chief complaint on file.  HPI   Rachel Barrera is a 24 y.o. female G1P0000 presenting with vaginal irritation. She complains of a swollen and itchy vagina, with vaginal pressure. She has been treated several times for UTI this pregnancy; last time was 4 weeks ago. She did not take her antibiotics as prescribed.   OB History    Gravida Para Term Preterm AB TAB SAB Ectopic Multiple Living   1 0 0 0 0 0 0 0 0 0       Past Medical History  Diagnosis Date  . Abnormal Pap smear 2010    cervical biopsy  . Vaginal Pap smear, abnormal     Past Surgical History  Procedure Laterality Date  . No past surgeries      Family History  Problem Relation Age of Onset  . Anesthesia problems Neg Hx   . Hypertension Mother     History  Substance Use Topics  . Smoking status: Former Smoker    Types: Cigarettes    Quit date: 12/31/2013  . Smokeless tobacco: Never Used  . Alcohol Use: Yes     Comment: stopped when found out pregnant-was drinking 3-4 drinks per week    Allergies: No Known Allergies  Prescriptions prior to admission  Medication Sig Dispense Refill Last Dose  . phenylephrine-shark liver oil-mineral oil-petrolatum (PREPARATION H) 0.25-3-14-71.9 % rectal ointment Place 1 application rectally 2 (two) times daily as needed for hemorrhoids.   08/30/2014 at Unknown time  . Prenatal Vit-Fe Fumarate-FA (PRENATAL VITAMINS PLUS) 27-1 MG TABS Take 1 tablet by mouth daily. 30 tablet 11 08/30/2014 at Unknown time  . docusate sodium (COLACE) 100 MG capsule Take 1 capsule (100 mg total) by mouth 2 (two) times daily as needed. (Patient not taking: Reported on 07/22/2014) 30 capsule 2 Not Taking at Unknown time  . nitrofurantoin, macrocrystal-monohydrate, (MACROBID) 100 MG capsule Take 1 capsule (100 mg total) by mouth 2 (two) times daily. (Patient not  taking: Reported on 07/22/2014) 14 capsule 0 Completed Course at Unknown time   Results for orders placed or performed during the hospital encounter of 08/31/14 (from the past 48 hour(s))  Urinalysis, Routine w reflex microscopic (not at [facility])     Status: Abnormal   Collection Time: 08/31/14  2:58 PM  Result Value Ref Range   Color, Urine YELLOW YELLOW   APPearance CLEAR CLEAR   Specific Gravity, Urine 1.025 1.005 - 1.030   pH 7.0 5.0 - 8.0   Glucose, UA NEGATIVE NEGATIVE mg/dL   Hgb urine dipstick MODERATE (A) NEGATIVE   Bilirubin Urine NEGATIVE NEGATIVE   Ketones, ur 15 (A) NEGATIVE mg/dL   Protein, ur NEGATIVE NEGATIVE mg/dL   Urobilinogen, UA 1.0 0.0 - 1.0 mg/dL   Nitrite NEGATIVE NEGATIVE   Leukocytes, UA TRACE (A) NEGATIVE  Urine microscopic-add on     Status: Abnormal   Collection Time: 08/31/14  2:58 PM  Result Value Ref Range   Squamous Epithelial / LPF FEW (A) RARE   WBC, UA 11-20 <3 WBC/hpf   RBC / HPF 3-6 <3 RBC/hpf   Bacteria, UA MANY (A) RARE   Urine-Other RARE YEAST     Comment: MUCOUS PRESENT    Review of Systems  Constitutional: Negative for fever and chills.  Genitourinary: Positive for dysuria, urgency and frequency. Negative for hematuria.  Physical Exam   Blood pressure 123/80, pulse 101, temperature 98.5 F (36.9 C), temperature source Oral, resp. rate 16, height 5' 3.5" (1.613 m), weight 95.369 kg (210 lb 4 oz), last menstrual period 02/17/2014.  Physical Exam  Constitutional: She is oriented to person, place, and time. She appears well-developed and well-nourished. No distress.  HENT:  Head: Normocephalic.  Eyes: Pupils are equal, round, and reactive to light.  GI: Soft.  Genitourinary:  Wet prep and GC collected without Speculum Vagina - Small amount of creamy discharge noted on external vagina    Musculoskeletal: Normal range of motion.  Neurological: She is alert and oriented to person, place, and time.  Skin: Skin is warm. She is not  diaphoretic.  Psychiatric: Her behavior is normal.   Fetal Tracing: Baseline: 140 bpm  Variability: moderate  Accelerations: 10x10 Decelerations: quick variables  Toco: quiet   MAU Course  Procedures  None  MDM  Urine culture pending    Assessment and Plan   A:  1. UTI (lower urinary tract infection)   2. Yeast vaginitis   3. Hemorrhoids during pregnancy, second trimester     P:  Discharge home in stable condition RX: Terazol 7        Macrobid         Corticain cream for Hemorrhoids Follow up with Dr. Gaynell Face as scheduled  Urine culture pending    Duane Lope, NP 08/31/2014 3:59 PM

## 2014-08-31 NOTE — MAU Note (Signed)
Pt states here for vaginal swelling and has urge to void, however voids very little. Was given abx last office visit for UTI. Did miss a couple of doses of abx. Denies bleeding or abnormal vaginal discharge. All s/s for past 3-4 days.

## 2014-09-02 LAB — URINE CULTURE
Culture: >=100000
SPECIAL REQUESTS: NORMAL

## 2014-10-22 LAB — OB RESULTS CONSOLE GBS: GBS: POSITIVE

## 2014-10-23 ENCOUNTER — Encounter (HOSPITAL_COMMUNITY): Payer: Self-pay | Admitting: *Deleted

## 2014-10-23 ENCOUNTER — Inpatient Hospital Stay (HOSPITAL_COMMUNITY)
Admission: AD | Admit: 2014-10-23 | Discharge: 2014-10-23 | Disposition: A | Discharge status: Home / Self Care | Payer: Medicaid Other | Source: Ambulatory Visit | Attending: Obstetrics | Admitting: Obstetrics

## 2014-10-23 DIAGNOSIS — Z87891 Personal history of nicotine dependence: Secondary | ICD-10-CM | POA: Insufficient documentation

## 2014-10-23 DIAGNOSIS — O368130 Decreased fetal movements, third trimester, not applicable or unspecified: Secondary | ICD-10-CM | POA: Diagnosis not present

## 2014-10-23 DIAGNOSIS — O368131 Decreased fetal movements, third trimester, fetus 1: Secondary | ICD-10-CM

## 2014-10-23 DIAGNOSIS — R519 Headache, unspecified: Secondary | ICD-10-CM

## 2014-10-23 DIAGNOSIS — Z3A35 35 weeks gestation of pregnancy: Secondary | ICD-10-CM | POA: Diagnosis not present

## 2014-10-23 DIAGNOSIS — R51 Headache: Secondary | ICD-10-CM | POA: Diagnosis not present

## 2014-10-23 LAB — URINE MICROSCOPIC-ADD ON

## 2014-10-23 LAB — URINALYSIS, ROUTINE W REFLEX MICROSCOPIC
BILIRUBIN URINE: NEGATIVE
Glucose, UA: NEGATIVE mg/dL
HGB URINE DIPSTICK: NEGATIVE
KETONES UR: 15 mg/dL — AB
Nitrite: NEGATIVE
PH: 7 (ref 5.0–8.0)
Protein, ur: NEGATIVE mg/dL
Specific Gravity, Urine: 1.02 (ref 1.005–1.030)
UROBILINOGEN UA: 0.2 mg/dL (ref 0.0–1.0)

## 2014-10-23 MED ORDER — ACETAMINOPHEN-CODEINE #3 300-30 MG PO TABS
2.0000 | ORAL_TABLET | Freq: Four times a day (QID) | ORAL | Status: DC | PRN
Start: 2014-10-23 — End: 2014-11-24

## 2014-10-23 MED ORDER — ACETAMINOPHEN-CODEINE #3 300-30 MG PO TABS
2.0000 | ORAL_TABLET | Freq: Once | ORAL | Status: DC
  Filled 2014-10-23: qty 2

## 2014-10-23 NOTE — MAU Note (Signed)
Pt. Reports headaches for 3 days on the left side, last night it got worse when laying in the right side.  States she has been having diarrhea for about 2 days.  Has noticed this morning that fetal movement is decreased.

## 2014-10-23 NOTE — MAU Provider Note (Signed)
History     CSN: [id number]  Arrival date and time: 10/23/14 1532   First Provider Initiated Contact with Patient 10/23/14 1606      Chief Complaint  Patient presents with  . Decreased Fetal Movement  . Diarrhea  . Headache   HPI Rachel Barrera 24 y.o. G1P0000 @ [redacted]w[redacted]d presents for decreased fetal movement today and a unilateral left sided headache since yesterday. She denies visual disturbances or epigastric pain. Denies LOF, Vaginal bleeding.  Past Medical History  Diagnosis Date  . Abnormal Pap smear 2010    cervical biopsy  . Vaginal Pap smear, abnormal     Past Surgical History  Procedure Laterality Date  . No past surgeries      Family History  Problem Relation Age of Onset  . Anesthesia problems Neg Hx   . Hypertension Mother     Social History  Substance Use Topics  . Smoking status: Former Smoker    Types: Cigarettes    Quit date: 12/31/2013  . Smokeless tobacco: Never Used  . Alcohol Use: Yes     Comment: stopped when found out pregnant-was drinking 3-4 drinks per week    Allergies: No Known Allergies  Prescriptions prior to admission  Medication Sig Dispense Refill Last Dose  . acetaminophen (TYLENOL) 325 MG tablet Take 325 mg by mouth every 6 (six) hours as needed for headache.   10/22/2014 at Unknown time  . docusate sodium (COLACE) 100 MG capsule Take 1 capsule (100 mg total) by mouth 2 (two) times daily as needed. (Patient taking differently: Take 100 mg by mouth 2 (two) times daily as needed for mild constipation. ) 30 capsule 2 Past Week at Unknown time  . phenylephrine-shark liver oil-mineral oil-petrolatum (PREPARATION H) 0.25-3-14-71.9 % rectal ointment Place 1 application rectally 2 (two) times daily as needed for hemorrhoids.   10/23/2014 at 1400  . Prenatal Vit-Fe Fumarate-FA (PRENATAL VITAMINS PLUS) 27-1 MG TABS Take 1 tablet by mouth daily. 30 tablet 11 10/22/2014 at Unknown time  . cephALEXin (KEFLEX) 500 MG capsule Take 1 capsule (500 mg  total) by mouth 4 (four) times daily. (Patient not taking: Reported on 10/23/2014) 20 capsule 0 Completed Course at Unknown time  . hydrocortisone acetate 0.5 % cream Apply topically 2 (two) times daily. (Patient not taking: Reported on 10/23/2014) 30 g 0 Completed Course at Unknown time  . terconazole (TERAZOL 7) 0.4 % vaginal cream Place 1 applicator vaginally at bedtime. (Patient not taking: Reported on 10/23/2014) 45 g 0 Completed Course at Unknown time    Review of Systems  Constitutional: Negative for fever.  Genitourinary:       Decreased fetal movement  Neurological: Positive for headaches.  All other systems reviewed and are negative.  Physical Exam   Blood pressure 118/66, pulse 102, temperature 99.3 F (37.4 C), temperature source Oral, resp. rate 18, last menstrual period 02/17/2014.  Physical Exam  Nursing note and vitals reviewed. Constitutional: She is oriented to person, place, and time. She appears well-developed and well-nourished. No distress.  HENT:  Head: Normocephalic and atraumatic.  Cardiovascular: Normal rate and regular rhythm.   Respiratory: Effort normal. No respiratory distress.  GI: Soft. There is no tenderness.  Musculoskeletal: Normal range of motion.  Neurological: She is alert and oriented to person, place, and time. She has normal reflexes.  Skin: Skin is warm and dry.  Psychiatric: She has a normal mood and affect. Her behavior is normal. Judgment and thought content normal.    MAU Course  Procedures  MDM NST- Reactive Category 1 Uc- none. Dr Gaynell Face aware and will give tylenol 3 x 2 tab for headache and discharge to home.  Assessment and Plan  Decreased Fetal Movement Headache  Discharge to home  [facility] 10/23/2014, 4:47 PM

## 2014-10-23 NOTE — MAU Note (Signed)
Diarrhea and HA X 3 days. No fever or N/V. Baby not moving as much.

## 2014-10-23 NOTE — Discharge Instructions (Signed)
Fetal Movement Counts °Patient Name: __________________________________________________ Patient Due Date: ____________________ °Performing a fetal movement count is highly recommended in high-risk pregnancies, but it is good for every pregnant woman to do. Your health care provider may ask you to start counting fetal movements at 28 weeks of the pregnancy. Fetal movements often increase: °· After eating a full meal. °· After physical activity. °· After eating or drinking something sweet or cold. °· At rest. °Pay attention to when you feel the baby is most active. This will help you notice a pattern of your baby's sleep and wake cycles and what factors contribute to an increase in fetal movement. It is important to perform a fetal movement count at the same time each day when your baby is normally most active.  °HOW TO COUNT FETAL MOVEMENTS °1. Find a quiet and comfortable area to sit or lie down on your left side. Lying on your left side provides the best blood and oxygen circulation to your baby. °2. Write down the day and time on a sheet of paper or in a journal. °3. Start counting kicks, flutters, swishes, rolls, or jabs in a 2-hour period. You should feel at least 10 movements within 2 hours. °4. If you do not feel 10 movements in 2 hours, wait 2-3 hours and count again. Look for a change in the pattern or not enough counts in 2 hours. °SEEK MEDICAL CARE IF: °· You feel less than 10 counts in 2 hours, tried twice. °· There is no movement in over an hour. °· The pattern is changing or taking longer each day to reach 10 counts in 2 hours. °· You feel the baby is not moving as he or she usually does. °Date: ____________ Movements: ____________ Start time: ____________ Finish time: ____________  °Date: ____________ Movements: ____________ Start time: ____________ Finish time: ____________ °Date: ____________ Movements: ____________ Start time: ____________ Finish time: ____________ °Date: ____________ Movements:  ____________ Start time: ____________ Finish time: ____________ °Date: ____________ Movements: ____________ Start time: ____________ Finish time: ____________ °Date: ____________ Movements: ____________ Start time: ____________ Finish time: ____________ °Date: ____________ Movements: ____________ Start time: ____________ Finish time: ____________ °Date: ____________ Movements: ____________ Start time: ____________ Finish time: ____________  °Date: ____________ Movements: ____________ Start time: ____________ Finish time: ____________ °Date: ____________ Movements: ____________ Start time: ____________ Finish time: ____________ °Date: ____________ Movements: ____________ Start time: ____________ Finish time: ____________ °Date: ____________ Movements: ____________ Start time: ____________ Finish time: ____________ °Date: ____________ Movements: ____________ Start time: ____________ Finish time: ____________ °Date: ____________ Movements: ____________ Start time: ____________ Finish time: ____________ °Date: ____________ Movements: ____________ Start time: ____________ Finish time: ____________  °Date: ____________ Movements: ____________ Start time: ____________ Finish time: ____________ °Date: ____________ Movements: ____________ Start time: ____________ Finish time: ____________ °Date: ____________ Movements: ____________ Start time: ____________ Finish time: ____________ °Date: ____________ Movements: ____________ Start time: ____________ Finish time: ____________ °Date: ____________ Movements: ____________ Start time: ____________ Finish time: ____________ °Date: ____________ Movements: ____________ Start time: ____________ Finish time: ____________ °Date: ____________ Movements: ____________ Start time: ____________ Finish time: ____________  °Date: ____________ Movements: ____________ Start time: ____________ Finish time: ____________ °Date: ____________ Movements: ____________ Start time: ____________ Finish  time: ____________ °Date: ____________ Movements: ____________ Start time: ____________ Finish time: ____________ °Date: ____________ Movements: ____________ Start time: ____________ Finish time: ____________ °Date: ____________ Movements: ____________ Start time: ____________ Finish time: ____________ °Date: ____________ Movements: ____________ Start time: ____________ Finish time: ____________ °Date: ____________ Movements: ____________ Start time: ____________ Finish time: ____________  °Date: ____________ Movements: ____________ Start time: ____________ Finish   time: ____________ Date: ____________ Movements: ____________ Start time: ____________ Rachel Barrera time: ____________ Date: ____________ Movements: ____________ Start time: ____________ Rachel Barrera time: ____________ Date: ____________ Movements: ____________ Start time: ____________ Rachel Barrera time: ____________ Date: ____________ Movements: ____________ Start time: ____________ Rachel Barrera time: ____________ Date: ____________ Movements: ____________ Start time: ____________ Rachel Barrera time: ____________ Date: ____________ Movements: ____________ Start time: ____________ Rachel Barrera time: ____________  Date: ____________ Movements: ____________ Start time: ____________ Rachel Barrera time: ____________ Date: ____________ Movements: ____________ Start time: ____________ Rachel Barrera time: ____________ Date: ____________ Movements: ____________ Start time: ____________ Rachel Barrera time: ____________ Date: ____________ Movements: ____________ Start time: ____________ Rachel Barrera time: ____________ Date: ____________ Movements: ____________ Start time: ____________ Rachel Barrera time: ____________ Date: ____________ Movements: ____________ Start time: ____________ Rachel Barrera time: ____________ Date: ____________ Movements: ____________ Start time: ____________ Rachel Barrera time: ____________  Date: ____________ Movements: ____________ Start time: ____________ Rachel Barrera time: ____________ Date: ____________  Movements: ____________ Start time: ____________ Rachel Barrera time: ____________ Date: ____________ Movements: ____________ Start time: ____________ Rachel Barrera time: ____________ Date: ____________ Movements: ____________ Start time: ____________ Rachel Barrera time: ____________ Date: ____________ Movements: ____________ Start time: ____________ Rachel Barrera time: ____________ Date: ____________ Movements: ____________ Start time: ____________ Rachel Barrera time: ____________ Date: ____________ Movements: ____________ Start time: ____________ Rachel Barrera time: ____________  Date: ____________ Movements: ____________ Start time: ____________ Rachel Barrera time: ____________ Date: ____________ Movements: ____________ Start time: ____________ Rachel Barrera time: ____________ Date: ____________ Movements: ____________ Start time: ____________ Rachel Barrera time: ____________ Date: ____________ Movements: ____________ Start time: ____________ Rachel Barrera time: ____________ Date: ____________ Movements: ____________ Start time: ____________ Rachel Barrera time: ____________ Date: ____________ Movements: ____________ Start time: ____________ Rachel Barrera time: ____________ Document Released: 03/11/2006 Document Revised: 06/26/2013 Document Reviewed: 12/07/2011 [employer] Patient Information 2015 [employer]. This information is not intended to replace advice given to you by your health care provider. Make sure you discuss any questions you have with your health care provider. Migraine Headache A migraine headache is an intense, throbbing pain on one or both sides of your head. A migraine can last for 30 minutes to several hours. CAUSES  The exact cause of a migraine headache is not always known. However, a migraine may be caused when nerves in the brain become irritated and release chemicals that cause inflammation. This causes pain. Certain things may also trigger migraines, such as:  Alcohol.  Smoking.  Stress.  Menstruation.  Aged cheeses.  Foods or drinks that  contain nitrates, glutamate, aspartame, or tyramine.  Lack of sleep.  Chocolate.  Caffeine.  Hunger.  Physical exertion.  Fatigue.  Medicines used to treat chest pain (nitroglycerine), birth control pills, estrogen, and some blood pressure medicines. SIGNS AND SYMPTOMS 5. Pain on one or both sides of your head. 6. Pulsating or throbbing pain. 7. Severe pain that prevents daily activities. 8. Pain that is aggravated by any physical activity. 9. Nausea, vomiting, or both. 10. Dizziness. 11. Pain with exposure to bright lights, loud noises, or activity. 12. General sensitivity to bright lights, loud noises, or smells. Before you get a migraine, you may get warning signs that a migraine is coming (aura). An aura may include:  Seeing flashing lights.  Seeing bright spots, halos, or zigzag lines.  Having tunnel vision or blurred vision.  Having feelings of numbness or tingling.  Having trouble talking.  Having muscle weakness. DIAGNOSIS  A migraine headache is often diagnosed based on:  Symptoms.  Physical exam.  A CT scan or MRI of your head. These imaging tests cannot diagnose migraines, but they can help rule out other causes of headaches. TREATMENT Medicines may be given for pain and  nausea. Medicines can also be given to help prevent recurrent migraines.  HOME CARE INSTRUCTIONS  Only take over-the-counter or prescription medicines for pain or discomfort as directed by your health care provider. The use of long-term narcotics is not recommended.  Lie down in a dark, quiet room when you have a migraine.  Keep a journal to find out what may trigger your migraine headaches. For example, write down:  What you eat and drink.  How much sleep you get.  Any change to your diet or medicines.  Limit alcohol consumption.  Quit smoking if you smoke.  Get 7-9 hours of sleep, or as recommended by your health care provider.  Limit stress.  Keep lights dim if bright  lights bother you and make your migraines worse. SEEK IMMEDIATE MEDICAL CARE IF:   Your migraine becomes severe.  You have a fever.  You have a stiff neck.  You have vision loss.  You have muscular weakness or loss of muscle control.  You start losing your balance or have trouble walking.  You feel faint or pass out.  You have severe symptoms that are different from your first symptoms. MAKE SURE YOU:   Understand these instructions.  Will watch your condition.  Will get help right away if you are not doing well or get worse. Document Released: 02/09/2005 Document Revised: 06/26/2013 Document Reviewed: 10/17/2012 [facility] Patient Information 2015 [place], [place]. This information is not intended to replace advice given to you by your health care provider. Make sure you discuss any questions you have with your health care provider.

## 2014-11-07 ENCOUNTER — Ambulatory Visit (INDEPENDENT_AMBULATORY_CARE_PROVIDER_SITE_OTHER): Payer: Self-pay | Admitting: Pediatrics

## 2014-11-07 DIAGNOSIS — Z7681 Expectant parent(s) prebirth pediatrician visit: Secondary | ICD-10-CM

## 2014-11-07 DIAGNOSIS — Z3490 Encounter for supervision of normal pregnancy, unspecified, unspecified trimester: Secondary | ICD-10-CM

## 2014-11-07 NOTE — Progress Notes (Signed)
Prenatal counseling for impending newborn done-- Z76.81  

## 2014-11-22 ENCOUNTER — Encounter (HOSPITAL_COMMUNITY): Payer: Self-pay | Admitting: *Deleted

## 2014-11-22 ENCOUNTER — Telehealth (HOSPITAL_COMMUNITY): Payer: Self-pay | Admitting: *Deleted

## 2014-11-22 NOTE — Telephone Encounter (Signed)
Preadmission screen  

## 2014-11-23 ENCOUNTER — Inpatient Hospital Stay (HOSPITAL_COMMUNITY)
Admission: RE | Admit: 2014-11-23 | Discharge: 2014-11-26 | DRG: 775 | Disposition: A | Discharge status: Home / Self Care | Payer: Medicaid Other | Source: Ambulatory Visit | Attending: Obstetrics | Admitting: Obstetrics

## 2014-11-23 ENCOUNTER — Inpatient Hospital Stay (HOSPITAL_COMMUNITY): Payer: Medicaid Other | Admitting: Anesthesiology

## 2014-11-23 ENCOUNTER — Encounter (HOSPITAL_COMMUNITY): Payer: Self-pay

## 2014-11-23 DIAGNOSIS — O480 Post-term pregnancy: Secondary | ICD-10-CM | POA: Diagnosis present

## 2014-11-23 DIAGNOSIS — Z3A40 40 weeks gestation of pregnancy: Secondary | ICD-10-CM

## 2014-11-23 DIAGNOSIS — Z3490 Encounter for supervision of normal pregnancy, unspecified, unspecified trimester: Secondary | ICD-10-CM

## 2014-11-23 DIAGNOSIS — O99824 Streptococcus B carrier state complicating childbirth: Secondary | ICD-10-CM | POA: Diagnosis present

## 2014-11-23 LAB — CBC
HCT: 36 % (ref 36.0–46.0)
HEMOGLOBIN: 11.7 g/dL — AB (ref 12.0–15.0)
MCH: 29 pg (ref 26.0–34.0)
MCHC: 32.5 g/dL (ref 30.0–36.0)
MCV: 89.1 fL (ref 78.0–100.0)
PLATELETS: 244 10*3/uL (ref 150–400)
RBC: 4.04 MIL/uL (ref 3.87–5.11)
RDW: 14.4 % (ref 11.5–15.5)
WBC: 7.6 10*3/uL (ref 4.0–10.5)

## 2014-11-23 LAB — TYPE AND SCREEN
ABO/RH(D): O POS
Antibody Screen: NEGATIVE

## 2014-11-23 LAB — ABO/RH: ABO/RH(D): O POS

## 2014-11-23 LAB — RPR: RPR: NONREACTIVE

## 2014-11-23 MED ORDER — OXYCODONE-ACETAMINOPHEN 5-325 MG PO TABS
1.0000 | ORAL_TABLET | ORAL | Status: DC | PRN
  Administered 2014-11-25: 1 via ORAL
  Filled 2014-11-23 (×3): qty 1

## 2014-11-23 MED ORDER — OXYTOCIN 40 UNITS IN LACTATED RINGERS INFUSION - SIMPLE MED: 62.5000 mL/h | INTRAVENOUS | Status: DC

## 2014-11-23 MED ORDER — PHENYLEPHRINE 40 MCG/ML (10ML) SYRINGE FOR IV PUSH (FOR BLOOD PRESSURE SUPPORT)
80.0000 ug | PREFILLED_SYRINGE | INTRAVENOUS | Status: DC | PRN
  Administered 2014-11-23 (×2): 80 ug via INTRAVENOUS
  Filled 2014-11-23: qty 20

## 2014-11-23 MED ORDER — EPHEDRINE 5 MG/ML INJ: 10.0000 mg | INTRAVENOUS | Status: DC | PRN

## 2014-11-23 MED ORDER — DEXTROSE 5 % IV SOLN
2.5000 10*6.[IU] | INTRAVENOUS | Status: DC
  Administered 2014-11-23 – 2014-11-24 (×4): 2.5 10*6.[IU] via INTRAVENOUS
  Filled 2014-11-23 (×8): qty 2.5

## 2014-11-23 MED ORDER — DIPHENHYDRAMINE HCL 50 MG/ML IJ SOLN: 12.5000 mg | INTRAMUSCULAR | Status: DC | PRN

## 2014-11-23 MED ORDER — CITRIC ACID-SODIUM CITRATE 334-500 MG/5ML PO SOLN
30.0000 mL | ORAL | Status: DC | PRN
Start: 2014-11-23 — End: 2014-11-24

## 2014-11-23 MED ORDER — OXYCODONE-ACETAMINOPHEN 5-325 MG PO TABS: 2.0000 | ORAL_TABLET | ORAL | Status: DC | PRN

## 2014-11-23 MED ORDER — TERBUTALINE SULFATE 1 MG/ML IJ SOLN: 0.2500 mg | Freq: Once | INTRAMUSCULAR | Status: DC | PRN

## 2014-11-23 MED ORDER — LIDOCAINE HCL (PF) 1 % IJ SOLN
INTRAMUSCULAR | Status: DC | PRN
  Administered 2014-11-23: 4 mL
  Administered 2014-11-23 (×3): 4 mL via EPIDURAL

## 2014-11-23 MED ORDER — LACTATED RINGERS IV SOLN: 500.0000 mL | INTRAVENOUS | Status: DC | PRN

## 2014-11-23 MED ORDER — SODIUM BICARBONATE 8.4 % IV SOLN
INTRAVENOUS | Status: DC | PRN
  Administered 2014-11-23 (×2): 4 mL via EPIDURAL

## 2014-11-23 MED ORDER — ACETAMINOPHEN 325 MG PO TABS: 650.0000 mg | ORAL_TABLET | ORAL | Status: DC | PRN

## 2014-11-23 MED ORDER — ONDANSETRON HCL 4 MG/2ML IJ SOLN
4.0000 mg | Freq: Four times a day (QID) | INTRAMUSCULAR | Status: DC | PRN
  Administered 2014-11-23: 4 mg via INTRAVENOUS
  Filled 2014-11-23: qty 2

## 2014-11-23 MED ORDER — LIDOCAINE HCL (PF) 1 % IJ SOLN
30.0000 mL | INTRAMUSCULAR | Status: AC | PRN
  Administered 2014-11-24: 30 mL via SUBCUTANEOUS
  Filled 2014-11-23: qty 30

## 2014-11-23 MED ORDER — PENICILLIN G POTASSIUM 5000000 UNITS IJ SOLR
5.0000 10*6.[IU] | Freq: Once | INTRAVENOUS | Status: AC
  Administered 2014-11-23: 5 10*6.[IU] via INTRAVENOUS
  Filled 2014-11-23: qty 5

## 2014-11-23 MED ORDER — FENTANYL 2.5 MCG/ML BUPIVACAINE 1/10 % EPIDURAL INFUSION (WH - ANES)
14.0000 mL/h | INTRAMUSCULAR | Status: DC | PRN
  Administered 2014-11-23 – 2014-11-24 (×3): 14 mL/h via EPIDURAL
  Filled 2014-11-23 (×3): qty 125

## 2014-11-23 MED ORDER — OXYTOCIN BOLUS FROM INFUSION
500.0000 mL | INTRAVENOUS | Status: DC
  Administered 2014-11-24: 500 mL via INTRAVENOUS

## 2014-11-23 MED ORDER — LACTATED RINGERS IV SOLN
INTRAVENOUS | Status: DC
  Administered 2014-11-23: 500 mL via INTRAVENOUS
  Administered 2014-11-23 – 2014-11-24 (×2): via INTRAVENOUS

## 2014-11-23 MED ORDER — BUTORPHANOL TARTRATE 1 MG/ML IJ SOLN
1.0000 mg | INTRAMUSCULAR | Status: DC | PRN
Start: 2014-11-23 — End: 2014-11-24

## 2014-11-23 MED ORDER — OXYTOCIN 40 UNITS IN LACTATED RINGERS INFUSION - SIMPLE MED
1.0000 m[IU]/min | INTRAVENOUS | Status: DC
  Administered 2014-11-23: 2 m[IU]/min via INTRAVENOUS
  Filled 2014-11-23: qty 1000

## 2014-11-23 NOTE — Anesthesia Procedure Notes (Addendum)
Epidural Patient location during procedure: OB Start time: 11/23/2014 3:48 PM  Staffing Anesthesiologist: Mal Amabile Performed by: anesthesiologist   Preanesthetic Checklist Completed: patient identified, site marked, surgical consent, pre-op evaluation, timeout performed, IV checked, risks and benefits discussed and monitors and equipment checked  Epidural Patient position: sitting Prep: site prepped and draped and DuraPrep Patient monitoring: continuous pulse ox and blood pressure Approach: midline Location: L3-L4 Injection technique: LOR air and LOR saline  Needle:  Needle type: Tuohy  Needle gauge: 17 G Needle length: 9 cm and 9 Needle insertion depth: 8 cm Catheter type: closed end flexible Catheter size: 19 Gauge Catheter at skin depth: 14 cm Test dose: negative and Other  Assessment Events: blood not aspirated, injection not painful, no injection resistance, negative IV test and no paresthesia  Additional Notes Patient identified. Risks and benefits discussed including failed block, incomplete  Pain control, post dural puncture headache, nerve damage, paralysis, blood pressure Changes, nausea, vomiting, reactions to medications-both toxic and allergic and post Partum back pain. All questions were answered. Patient expressed understanding and wished to proceed. Sterile technique was used throughout procedure. Epidural site was Dressed with sterile barrier dressing. No paresthesias, signs of intravascular injection Or signs of intrathecal spread were encountered. Difficult due to position. Attempt x 3.  Patient was more comfortable after the epidural was dosed. Please see RN's note for documentation of vital signs and FHR which are stable.   Epidural Patient location during procedure: OB Start time: 11/23/2014 6:50 PM  Staffing Anesthesiologist: Mal Amabile  Preanesthetic Checklist Completed: patient identified, site marked, surgical consent, pre-op  evaluation, timeout performed, IV checked, risks and benefits discussed and monitors and equipment checked  Epidural Patient position: sitting Prep: site prepped and draped and DuraPrep Patient monitoring: continuous pulse ox and blood pressure Approach: midline Location: L3-L4 Injection technique: LOR air  Needle:  Needle type: Tuohy  Needle gauge: 17 G Needle length: 9 cm and 9 Needle insertion depth: 7 cm Catheter type: closed end flexible Catheter size: 19 Gauge Catheter at skin depth: 12 cm Test dose: negative and Other  Assessment Events: blood not aspirated, injection not painful, no injection resistance, negative IV test and no paresthesia  Additional Notes Patient identified. Risks and benefits discussed including failed block, incomplete  Pain control, post dural puncture headache, nerve damage, paralysis, blood pressure Changes, nausea, vomiting, reactions to medications-both toxic and allergic and post Partum back pain. All questions were answered. Patient expressed understanding and wished to proceed. Sterile technique was used throughout procedure. Epidural site was Dressed with sterile barrier dressing. No paresthesias, signs of intravascular injection Or signs of intrathecal spread were encountered.  Patient was more comfortable after the epidural was dosed. Please see RN's note for documentation of vital signs and FHR which are stable.

## 2014-11-23 NOTE — H&P (Signed)
This is Dr. Francoise Ceo dictating the history and physical on  Rachel Barrera  she's a 24 year old gravida 1 at 28 weeks and 6 days EDC 11/24/2014 positive GBS for which she received penicillin and the patient is in for induction cervix 1 cm 90% vertex floating amniotomy performed fluids clear and IUPC inserted Past medical history negative Past surgical history negative Social history negative System review negative Physical exam well-developed female not in labor HEENT negative Lungs clear to P&A Heart regular rhythm no murmurs no gallops Breasts negative Abdomen term Pelvic as described above Extremities negative

## 2014-11-23 NOTE — Anesthesia Preprocedure Evaluation (Signed)
Anesthesia Evaluation  Patient identified by MRN, date of birth, ID band Patient awake    Reviewed: Allergy & Precautions, Patient's Chart, lab work & pertinent test results  Airway Mallampati: III  TM Distance: >3 FB Neck ROM: Full    Dental no notable dental hx. (+) Teeth Intact   Pulmonary former smoker,    Pulmonary exam normal breath sounds clear to auscultation       Cardiovascular negative cardio ROS Normal cardiovascular exam Rhythm:Regular Rate:Normal     Neuro/Psych negative neurological ROS  negative psych ROS   GI/Hepatic Neg liver ROS, GERD  ,  Endo/Other  Obesity   Renal/GU negative Renal ROS  negative genitourinary   Musculoskeletal negative musculoskeletal ROS (+)   Abdominal (+) + obese,   Peds  Hematology negative hematology ROS (+)   Anesthesia Other Findings   Reproductive/Obstetrics (+) Pregnancy                             Anesthesia Physical Anesthesia Plan  ASA: II  Anesthesia Plan: Epidural   Post-op Pain Management:    Induction:   Airway Management Planned: Natural Airway  Additional Equipment:   Intra-op Plan:   Post-operative Plan:   Informed Consent: I have reviewed the patients History and Physical, chart, labs and discussed the procedure including the risks, benefits and alternatives for the proposed anesthesia with the patient or authorized representative who has indicated his/her understanding and acceptance.     Plan Discussed with: Anesthesiologist  Anesthesia Plan Comments:         Anesthesia Quick Evaluation

## 2014-11-24 ENCOUNTER — Encounter (HOSPITAL_COMMUNITY): Payer: Self-pay

## 2014-11-24 LAB — CBC
HCT: 30.3 % — ABNORMAL LOW (ref 36.0–46.0)
Hemoglobin: 9.8 g/dL — ABNORMAL LOW (ref 12.0–15.0)
MCH: 28.8 pg (ref 26.0–34.0)
MCHC: 32.3 g/dL (ref 30.0–36.0)
MCV: 89.1 fL (ref 78.0–100.0)
PLATELETS: 204 10*3/uL (ref 150–400)
RBC: 3.4 MIL/uL — ABNORMAL LOW (ref 3.87–5.11)
RDW: 14.4 % (ref 11.5–15.5)
WBC: 19.7 10*3/uL — AB (ref 4.0–10.5)

## 2014-11-24 MED ORDER — ZOLPIDEM TARTRATE 5 MG PO TABS: 5.0000 mg | ORAL_TABLET | Freq: Every evening | ORAL | Status: DC | PRN

## 2014-11-24 MED ORDER — ACETAMINOPHEN 325 MG PO TABS: 650.0000 mg | ORAL_TABLET | ORAL | Status: DC | PRN

## 2014-11-24 MED ORDER — IBUPROFEN 600 MG PO TABS
600.0000 mg | ORAL_TABLET | Freq: Four times a day (QID) | ORAL | Status: DC
  Administered 2014-11-24 – 2014-11-26 (×8): 600 mg via ORAL
  Filled 2014-11-24 (×8): qty 1

## 2014-11-24 MED ORDER — SIMETHICONE 80 MG PO CHEW: 80.0000 mg | CHEWABLE_TABLET | ORAL | Status: DC | PRN

## 2014-11-24 MED ORDER — BENZOCAINE-MENTHOL 20-0.5 % EX AERO
1.0000 "application " | INHALATION_SPRAY | CUTANEOUS | Status: DC | PRN
  Administered 2014-11-24: 1 via TOPICAL
  Filled 2014-11-24: qty 56

## 2014-11-24 MED ORDER — SENNOSIDES-DOCUSATE SODIUM 8.6-50 MG PO TABS
2.0000 | ORAL_TABLET | ORAL | Status: DC
  Administered 2014-11-25: 2 via ORAL
  Filled 2014-11-24: qty 2

## 2014-11-24 MED ORDER — ONDANSETRON HCL 4 MG PO TABS: 4.0000 mg | ORAL_TABLET | ORAL | Status: DC | PRN

## 2014-11-24 MED ORDER — DIPHENHYDRAMINE HCL 25 MG PO CAPS: 25.0000 mg | ORAL_CAPSULE | Freq: Four times a day (QID) | ORAL | Status: DC | PRN

## 2014-11-24 MED ORDER — ONDANSETRON HCL 4 MG/2ML IJ SOLN: 4.0000 mg | INTRAMUSCULAR | Status: DC | PRN

## 2014-11-24 MED ORDER — FERROUS SULFATE 325 (65 FE) MG PO TABS
325.0000 mg | ORAL_TABLET | Freq: Two times a day (BID) | ORAL | Status: DC
  Administered 2014-11-24 – 2014-11-26 (×5): 325 mg via ORAL
  Filled 2014-11-24 (×5): qty 1

## 2014-11-24 MED ORDER — LANOLIN HYDROUS EX OINT: TOPICAL_OINTMENT | CUTANEOUS | Status: DC | PRN

## 2014-11-24 MED ORDER — PRENATAL MULTIVITAMIN CH
1.0000 | ORAL_TABLET | Freq: Every day | ORAL | Status: DC
  Administered 2014-11-24 – 2014-11-25 (×2): 1 via ORAL
  Filled 2014-11-24 (×2): qty 1

## 2014-11-24 MED ORDER — OXYCODONE-ACETAMINOPHEN 5-325 MG PO TABS
1.0000 | ORAL_TABLET | ORAL | Status: DC | PRN
  Administered 2014-11-25 (×2): 1 via ORAL
  Filled 2014-11-24: qty 1

## 2014-11-24 MED ORDER — TETANUS-DIPHTH-ACELL PERTUSSIS 5-2.5-18.5 LF-MCG/0.5 IM SUSP
0.5000 mL | Freq: Once | INTRAMUSCULAR | Status: AC
  Administered 2014-11-25: 0.5 mL via INTRAMUSCULAR
  Filled 2014-11-24: qty 0.5

## 2014-11-24 MED ORDER — DIBUCAINE 1 % RE OINT: 1.0000 "application " | TOPICAL_OINTMENT | RECTAL | Status: DC | PRN

## 2014-11-24 MED ORDER — OXYCODONE-ACETAMINOPHEN 5-325 MG PO TABS: 2.0000 | ORAL_TABLET | ORAL | Status: DC | PRN

## 2014-11-24 MED ORDER — WITCH HAZEL-GLYCERIN EX PADS
1.0000 "application " | MEDICATED_PAD | CUTANEOUS | Status: DC | PRN
  Administered 2014-11-24: 1 via TOPICAL

## 2014-11-24 NOTE — Progress Notes (Signed)
Patient ID: Rachel Barrera, female   DOB: October 07, 1990, 24 y.o.   MRN: [id number] Postpartum day 0 Blood pressure 116/57 respiration 18 pulse 108 Fundus firm Lochia moderate Legs negative doing well

## 2014-11-24 NOTE — Anesthesia Postprocedure Evaluation (Signed)
Anesthesia Post Note  Patient: Rachel Barrera  Procedure(s) Performed: * No procedures listed *  Anesthesia type: Epidural  Patient location: Mother/Baby  Post pain: Pain level controlled  Post assessment: Post-op Vital signs reviewed  Last Vitals:  Filed Vitals:   11/24/14 0940  BP: 120/45  Pulse: 106  Temp: 37.1 C  Resp: 20    Post vital signs: Reviewed  Level of consciousness:alert  Complications: No apparent anesthesia complications

## 2014-11-24 NOTE — Lactation Note (Signed)
This note was copied from the chart of Rachel Barrera. Lactation Consultation Note  Patient Name: Rachel Barrera RUEAV'W Date: 11/24/2014 Reason for consult: Initial assessment  With this first time mom,and term baby, now 21 hours old. The baby fed frequently this morning, but was asleep after her bath, going 6 hours without feeding. Baby has passed stool twice, no void yet. Mom was doing skin to skin, so I showed her how to gently wake the baby. First I had her express some colostrum, and with a gloved finger, I did some suck training. I noted while doing this a posterior short, thin tongue frenulum, very close to the front of her tongue. the baby can easily get her tongue just over the gum line, but with sucking has a large hump in the back of her tongue, and she is not able to pull my finger in with sucking. Despite this, with latch mom denied any discomfort, the baby maintained latch  with strong , rhythmic suckles, for 7-8 minutes, and then unlatched on her own. Basic teaching from the baby and me book done, on breastfeeding, as well as review of lactation services. Mom knows to call for questions/concerns.   Maternal Data Formula Feeding for Exclusion: No Has patient been taught Hand Expression?: Yes Does the patient have breastfeeding experience prior to this delivery?: No  Feeding Feeding Type: Breast Fed Length of feed: 7 min  LATCH Score/Interventions Latch: Grasps breast easily, tongue down, lips flanged, rhythmical sucking. Intervention(s): Adjust position;Assist with latch  Audible Swallowing: A few with stimulation  Type of Nipple: Everted at rest and after stimulation  Comfort (Breast/Nipple): Soft / non-tender     Hold (Positioning): Assistance needed to correctly position infant at breast and maintain latch. Intervention(s): Breastfeeding basics reviewed;Support Pillows;Position options;Skin to skin  LATCH Score: 8  Lactation Tools Discussed/Used     Consult  Status Consult Status: Follow-up Date: 11/25/14 Follow-up type: In-patient    Alfred Levins 11/24/2014, 2:40 PM

## 2014-11-24 NOTE — Progress Notes (Signed)
CLINICAL SOCIAL WORK MATERNAL/CHILD NOTE  Patient Details  Name: Rachel Barrera MRN: [id number] Date of Birth: 11/24/2016  Date: 11/24/2014  Clinical Social Worker Initiating Note: Lenward Able, LCSWDate/ Time Initiated: 11/24/14/1130   Child's Name: Rachel Barrera   Legal Guardian: Mother   Need for Interpreter: None   Date of Referral: 11/23/14   Reason for Referral: Other (Comment)   Referral Source: [employer]   Address: [address] [place],  [address]  Phone number:  [phone])   Household Members: Self   Natural Supports (not living in the home): Immediate Family   Professional Supports:None   Employment:Full-time   Type of Work: CNA   Education:     Museum/gallery curator Resources:Medicaid   Other Resources: [employer], Physicist, medical    Cultural/Religious Considerations Which May Impact Care: none noted  Strengths: Ability to meet basic needs , Home prepared for child    Risk Factors/Current Problems:  (Hx of marijuana use)   Cognitive State: Alert , Able to Concentrate    Mood/Affect: Calm , Happy    CSW Assessment: Acknowledged order for social work consult to assess mother's hx of marijuana use. Met with mother who was pleasant and receptive to CSW. She is a single parent with no other dependents. FOB is uninvolved. She admits to recreational use of marijuana, and states that she stopped once she became aware of the pregnancy. She denies any treatment history or need for treatment. She denies any hx of alcohol dependency, or other illicit drug use. UDS on newborn is pending. Mother denies any hx of mental illness. Informed that she is well prepared at home for newborn. Mother informed of social work Fish farm manager.  CSW Plan/Description:    Mother informed of the hospital's drug screening policy No barriers to discharge Will continue to monitor drug screen.  Gilbert Manolis,  Steven Veazie J, LCSW 11/24/2014, 3:34 PM

## 2014-11-25 NOTE — Progress Notes (Signed)
Patient ID: Rachel Barrera, female   DOB: December 07, 1990, 24 y.o.   MRN: [id number] Post partum day 1 Blood pressure 1050 48 respiration 20 pulse 78 Fundus firm Legs negative Doing well

## 2014-11-25 NOTE — Lactation Note (Signed)
This note was copied from the chart of Rachel Sarin [place]. Lactation Consultation Note  Patient Name: Rachel Barrera ZOXWR'U Date: 11/25/2014 Reason for consult: Follow-up assessment Mom reports bf is "OK" the baby was cluster feeding early this am and the Pediatrician told her that was not normal, so she started offering formula. Suggested that she give both breast for at least 10 minutes each, more if the baby will stay on then f/u with small amount of formula as needed. Went over milk transition, belly size, feeding frequency, and breast care. Discussed engorgement prevent/treatment and mastitis. She is aware of O/P lactation and support groups.     Maternal Data    Feeding Feeding Type: Bottle Fed - Formula Nipple Type: Slow - flow Length of feed: 20 min  LATCH Score/Interventions Latch:  (baby was sleeping and mom was eating )                    Lactation Tools Discussed/Used     Consult Status Consult Status: Follow-up Date: 11/26/14 Follow-up type: In-patient    Rulon Eisenmenger 11/25/2014, 8:33 PM

## 2014-11-26 NOTE — Lactation Note (Signed)
This note was copied from the chart of Rachel Yvonne Stopher. Lactation Consultation Note  Patient Name: Rachel Barrera ZOXWR'U Date: 11/26/2014 Reason for consult: Follow-up assessment Baby 55 hours old. Mom reports that she has been giving bottles because she doesn't feel like the baby is satisfied at the breast. Mom states that she is able to hand express colostrum. Discussed supply and demand, engorgement prevention/treatment, and enc nursing with cues and using hand pump for additional stimulation as needed. Mom given hand pump with demonstration. Mom referred to Baby and Me booklet for EBM storage guidelines and number of diapers to expect by day of life. Mom aware of OP/BFSG and LC phone line assistance after D/C.   Maternal Data    Feeding Feeding Type: Formula  LATCH Score/Interventions                      Lactation Tools Discussed/Used     Consult Status Consult Status: Complete    Rachel Barrera 11/26/2014, 9:55 AM

## 2014-11-26 NOTE — Progress Notes (Signed)
Patient ID: Rachel Barrera, female   DOB: [birth year], 24 y.o.   MRN: [id number] Postpartum day 2 Blood pressure 125/64 respiration 20 pulse 85 afebrile Fundus firm Lochia moderate Legs negative doing well home today

## 2014-11-26 NOTE — Discharge Instructions (Signed)
Discharge instructions   You can wash your hair  Shower  Eat what you want  Drink what you want  See me in 6 weeks  Your ankles are going to swell more in the next 2 weeks than when pregnant  No sex for 6 weeks   Mir Fullilove A, MD 11/26/2014

## 2014-11-26 NOTE — Discharge Summary (Signed)
Obstetric Discharge Summary Reason for Admission: induction of labor Prenatal Procedures: none Intrapartum Procedures: spontaneous vaginal delivery Postpartum Procedures: none Complications-Operative and Postpartum: none HEMOGLOBIN  Date Value Ref Range Status  11/24/2014 9.8* 12.0 - 15.0 g/dL Final   HCT  Date Value Ref Range Status  11/24/2014 30.3* 36.0 - 46.0 % Final    Physical Exam:  General: alert Lochia: appropriate Uterine Fundus: firm Incision: healing well DVT Evaluation: No evidence of DVT seen on physical exam.  Discharge Diagnoses: Term Pregnancy-delivered  Discharge Information: Date: 11/26/2014 Activity: pelvic rest Diet: routine Medications: Percocet Condition: stable Instructions: refer to practice specific booklet Discharge to: home Follow-up Information    Follow up with Kathreen Cosier, MD.   Specialty:  Obstetrics and Gynecology   Contact information:   [address] [place] [address] [phone]       Newborn Data: Live born female  Birth Weight: 7 lb 13.6 oz (3560 g) APGAR: 8, 9  Home with mother.  Rachel Barrera A 11/26/2014, 7:02 AM

## 2015-05-07 ENCOUNTER — Other Ambulatory Visit: Payer: Medicaid Other

## 2015-05-07 ENCOUNTER — Ambulatory Visit
Admission: RE | Admit: 2015-05-07 | Discharge: 2015-05-07 | Disposition: A | Discharge status: Home / Self Care | Payer: Medicaid Other | Source: Ambulatory Visit | Attending: Oral Surgery | Admitting: Oral Surgery

## 2015-05-07 ENCOUNTER — Other Ambulatory Visit: Payer: Self-pay | Admitting: Oral Surgery

## 2015-05-07 DIAGNOSIS — R220 Localized swelling, mass and lump, head: Secondary | ICD-10-CM

## 2015-05-07 MED ORDER — IOPAMIDOL (ISOVUE-300) INJECTION 61%
75.0000 mL | Freq: Once | INTRAVENOUS | Status: AC | PRN
  Administered 2015-05-07: 75 mL via INTRAVENOUS

## 2015-05-27 ENCOUNTER — Emergency Department (HOSPITAL_COMMUNITY)
Admission: EM | Admit: 2015-05-27 | Discharge: 2015-05-27 | Disposition: A | Discharge status: Home / Self Care | Payer: Medicaid Other | Attending: Emergency Medicine | Admitting: Emergency Medicine

## 2015-05-27 ENCOUNTER — Encounter (HOSPITAL_COMMUNITY): Payer: Self-pay | Admitting: Emergency Medicine

## 2015-05-27 DIAGNOSIS — Z87891 Personal history of nicotine dependence: Secondary | ICD-10-CM | POA: Diagnosis not present

## 2015-05-27 DIAGNOSIS — R51 Headache: Secondary | ICD-10-CM | POA: Diagnosis not present

## 2015-05-27 DIAGNOSIS — L989 Disorder of the skin and subcutaneous tissue, unspecified: Secondary | ICD-10-CM | POA: Insufficient documentation

## 2015-05-27 DIAGNOSIS — R519 Headache, unspecified: Secondary | ICD-10-CM

## 2015-05-27 MED ORDER — TRAMADOL-ACETAMINOPHEN 37.5-325 MG PO TABS: 1.0000 | ORAL_TABLET | ORAL | Status: DC | PRN

## 2015-05-27 MED ORDER — PENICILLIN V POTASSIUM 500 MG PO TABS: 500.0000 mg | ORAL_TABLET | Freq: Four times a day (QID) | ORAL | Status: AC

## 2015-05-27 NOTE — ED Notes (Signed)
Had wisdom teeth removed in February. Had pain and swelling in face in March, had CT scan at dentist a couple weeks ago, had an abscess drained, packed, and removed at dentist. Pt states all that helped for short period of time, now abscess has returned. Denies difficulty breathing, swallowing, talking.

## 2015-05-27 NOTE — ED Provider Notes (Signed)
CSN: [id number]     Arrival date & time 05/27/15  0751 History   First MD Initiated Contact with Patient 05/27/15 [phone]     Chief Complaint  Patient presents with  . Facial Swelling  . Abscess     (Consider location/radiation/quality/duration/timing/severity/associated sxs/prior Treatment) HPI  25yf with L facial pain. Reports wisdom tooth extraction in February. Developed abscess which was drained by dentist several weeks ago and symptoms improved. Did not take abx as prescribed though. Within past day has had increasing pain in L face. Mild swelling. No fever or chills. No difficulty with breathing/swallowing.   Past Medical History  Diagnosis Date  . Abnormal Pap smear 2010    cervical biopsy  . Vaginal Pap smear, abnormal    Past Surgical History  Procedure Laterality Date  . No past surgeries     Family History  Problem Relation Age of Onset  . Anesthesia problems Neg Hx   . Hypertension Mother    Social History  Substance Use Topics  . Smoking status: Former Smoker    Types: Cigarettes    Quit date: 12/31/2013  . Smokeless tobacco: Never Used  . Alcohol Use: Yes     Comment: stopped when found out pregnant-was drinking 3-4 drinks per week   OB History    Gravida Para Term Preterm AB TAB SAB Ectopic Multiple Living   0 0 0 0 0 0 1     Review of Systems  All systems reviewed and negative, other than as noted in HPI.   Allergies  Review of patient's allergies indicates no known allergies.  Home Medications   Prior to Admission medications   Medication Sig Start Date End Date Taking? Authorizing Provider  penicillin v potassium (VEETID) 500 MG tablet Take 1 tablet (500 mg total) by mouth 4 (four) times daily. 05/27/15 06/03/15  Raeford Razor, MD  traMADol-acetaminophen (ULTRACET) 37.5-325 MG tablet Take 1 tablet by mouth every 4 (four) hours as needed. 05/27/15   Raeford Razor, MD   BP 132/90 mmHg  Pulse 105  Temp(Src) 97.7 F (36.5 C) (Oral)  Resp 18   SpO2 100%  LMP 04/23/2015 Physical Exam  Constitutional: She appears well-developed and well-nourished. No distress.  HENT:  Head: Normocephalic and atraumatic.    Small scar in pictured area consistent with previous I&D. ~1cm firm, nontender, subcutaneous lesion just superior to it. No fluctuunce. No celllulitis. Minimal L facial swelling. No concerning intraoral findings.   Eyes: Conjunctivae are normal. Right eye exhibits no discharge. Left eye exhibits no discharge.  Neck: Neck supple.  Cardiovascular: Normal rate, regular rhythm and normal heart sounds.  Exam reveals no gallop and no friction rub.   No murmur heard. Pulmonary/Chest: Effort normal and breath sounds normal. No respiratory distress.  Abdominal: Soft. She exhibits no distension. There is no tenderness.  Musculoskeletal: She exhibits no edema or tenderness.  Lymphadenopathy:    She has no cervical adenopathy.  Neurological: She is alert.  Skin: Skin is warm and dry.  Psychiatric: She has a normal mood and affect. Her behavior is normal. Thought content normal.  Nursing note and vitals reviewed.   ED Course  Procedures (including critical care time) Labs Review Labs Reviewed - No data to display  Imaging Review No results found. I have personally reviewed and evaluated these images and lab results as part of my medical decision-making.   EKG Interpretation None      MDM   Final diagnoses:  Facial pain  25yf with mild L facial swelling and pain. Small lesion L neck, but not fluctuant. No evidence o fairway compromise. PRN pain meds, abx and needs definitive dental FU.     Raeford RazorStephen Loistine Eberlin, MD 05/27/15 [phone]

## 2015-05-27 NOTE — ED Notes (Signed)
Awake. Verbally responsive. A/O x4. Resp even and unlabored. No audible adventitious breath sounds noted. ABC's intact.  

## 2015-09-01 ENCOUNTER — Encounter (HOSPITAL_COMMUNITY): Payer: Self-pay | Admitting: *Deleted

## 2015-09-01 ENCOUNTER — Emergency Department (HOSPITAL_COMMUNITY)
Admission: EM | Admit: 2015-09-01 | Discharge: 2015-09-01 | Disposition: A | Discharge status: Home / Self Care | Payer: Medicaid Other | Attending: Dermatology | Admitting: Dermatology

## 2015-09-01 DIAGNOSIS — Z5321 Procedure and treatment not carried out due to patient leaving prior to being seen by health care provider: Secondary | ICD-10-CM | POA: Insufficient documentation

## 2015-09-01 DIAGNOSIS — Z87891 Personal history of nicotine dependence: Secondary | ICD-10-CM | POA: Diagnosis not present

## 2015-09-01 DIAGNOSIS — J029 Acute pharyngitis, unspecified: Secondary | ICD-10-CM | POA: Diagnosis present

## 2015-09-01 NOTE — ED Notes (Signed)
Pt reports a throbbing headache and and a sore throat since Thursday.  Pt reports having chills on Thursday.  Pt took pain medication and helped with the throbbing.  Pt reports coughing up clear mucus.  Pt reports getting sweaty.

## 2015-11-08 LAB — GLUCOSE, POCT (MANUAL RESULT ENTRY): POC Glucose: 99 mg/dl (ref 70–99)

## 2015-11-25 ENCOUNTER — Ambulatory Visit: Payer: Medicaid Other | Attending: Family Medicine | Admitting: Family Medicine

## 2015-11-25 ENCOUNTER — Encounter: Payer: Self-pay | Admitting: Family Medicine

## 2015-11-25 VITALS — BP 106/71 | HR 93 | Temp 98.4°F | Ht 63.0 in | Wt 227.2 lb

## 2015-11-25 DIAGNOSIS — L720 Epidermal cyst: Secondary | ICD-10-CM

## 2015-11-25 DIAGNOSIS — Z87891 Personal history of nicotine dependence: Secondary | ICD-10-CM | POA: Insufficient documentation

## 2015-11-25 DIAGNOSIS — R220 Localized swelling, mass and lump, head: Secondary | ICD-10-CM | POA: Insufficient documentation

## 2015-11-25 MED ORDER — SULFAMETHOXAZOLE-TRIMETHOPRIM 400-80 MG PO TABS: 1.0000 | ORAL_TABLET | Freq: Two times a day (BID) | ORAL | 0 refills | Status: DC

## 2015-11-25 MED ORDER — NAPROXEN 500 MG PO TABS: 500.0000 mg | ORAL_TABLET | Freq: Two times a day (BID) | ORAL | 0 refills | Status: DC

## 2015-11-25 NOTE — Progress Notes (Signed)
LOGO@  Subjective:  Patient ID: Rachel Barrera, female    DOB: 03/16/1990  Age: 25 y.o. MRN: [id number]  CC: Establish Care   HPI Rachel Barrera presents for   1. Lump on face: started in for past 8 months. Started a month following wisdom tooth retraction. Has had I&D done twice. Now has 3 lumps on the L side of her face. There is associated pain and swelling. Intermittent drainage of pus and blood. She was treated with antibiotics and pain medicine on 09/01/2015 after presenting to the ED.   OB History    Gravida Para Term Preterm AB Living   1 1 1  0 0 1   SAB TAB Ectopic Multiple Live Births   0 0 0 0 1     Past Medical History:  Diagnosis Date  . Abnormal Pap smear 2010   cervical biopsy  . Vaginal Pap smear, abnormal     Past Surgical History:  Procedure Laterality Date  . NO PAST SURGERIES      Family History  Problem Relation Age of Onset  . Anesthesia problems Neg Hx   . Hypertension Mother     Social History  Substance Use Topics  . Smoking status: Former Smoker    Types: Cigarettes    Quit date: 12/31/2013  . Smokeless tobacco: Never Used  . Alcohol use Yes     Comment: stopped when found out pregnant-was drinking 3-4 drinks per week    ROS Review of Systems  Constitutional: Negative for chills and fever.  HENT: Positive for facial swelling.   Eyes: Negative for visual disturbance.  Respiratory: Negative for shortness of breath.   Cardiovascular: Negative for chest pain.  Gastrointestinal: Negative for abdominal pain and blood in stool.  Musculoskeletal: Negative for arthralgias and back pain.  Skin: Negative for rash.  Allergic/Immunologic: Negative for immunocompromised state.  Hematological: Negative for adenopathy. Does not bruise/bleed easily.  Psychiatric/Behavioral: Negative for dysphoric mood and suicidal ideas.    Objective:   Today's Vitals: BP 106/71 (BP Location: Left Arm, Patient Position: Sitting, Cuff Size: Small)   Pulse 93   Temp  98.4 F (36.9 C) (Oral)   Ht 5\' 3"  (1.6 m)   Wt 227 lb 3.2 oz (103.1 kg)   LMP 11/24/2015   SpO2 100%   BMI 40.25 kg/m   Physical Exam  Constitutional: She is oriented to person, place, and time. She appears well-developed and well-nourished. No distress.  HENT:  Head: Normocephalic and atraumatic.    Cardiovascular: Normal rate, regular rhythm, normal heart sounds and intact distal pulses.   Pulmonary/Chest: Effort normal and breath sounds normal.  Musculoskeletal: She exhibits no edema.  Neurological: She is alert and oriented to person, place, and time.  Skin: Skin is warm and dry. No rash noted.  Psychiatric: She has a normal mood and affect.    Assessment & Plan:   Problem List Items Addressed This Visit    None    Visit Diagnoses    Epidermal cyst of face    -  Primary   Relevant Medications   naproxen (NAPROSYN) 500 MG tablet   sulfamethoxazole-trimethoprim (BACTRIM) 400-80 MG tablet   Other Relevant Orders   Ambulatory referral to Dermatology      Outpatient Encounter Prescriptions as of 11/25/2015  Medication Sig  . traMADol-acetaminophen (ULTRACET) 37.5-325 MG tablet Take 1 tablet by mouth every 4 (four) hours as needed. (Patient not taking: Reported on 11/25/2015)   No facility-administered encounter medications on file as of  11/25/2015.     Follow-up: No Follow-up on file.    Dessa PhiJosalyn Neithan Day MD

## 2015-11-25 NOTE — Patient Instructions (Signed)
Rachel Barrera was seen today for establish care.  Diagnoses and all orders for this visit:  Epidermal cyst of face -     Ambulatory referral to Dermatology -     naproxen (NAPROSYN) 500 MG tablet; Take 1 tablet (500 mg total) by mouth 2 (two) times daily with a meal. -     sulfamethoxazole-trimethoprim (BACTRIM) 400-80 MG tablet; Take 1 tablet by mouth 2 (two) times daily.    F/u in 4 weeks for wellness physical  Dr. Armen PickupFunches  Epidermal Cyst An epidermal cyst is sometimes called a sebaceous cyst, epidermal inclusion cyst, or infundibular cyst. These cysts usually contain a substance that looks "pasty" or "cheesy" and may have a bad smell. This substance is a protein called keratin. Epidermal cysts are usually found on the face, neck, or trunk. They may also occur in the vaginal area or other parts of the genitalia of both men and women. Epidermal cysts are usually small, painless, slow-growing bumps or lumps that move freely under the skin. It is important not to try to pop them. This may cause an infection and lead to tenderness and swelling. CAUSES  Epidermal cysts may be caused by a deep penetrating injury to the skin or a plugged hair follicle, often associated with acne. SYMPTOMS  Epidermal cysts can become inflamed and cause:  Redness.  Tenderness.  Increased temperature of the skin over the bumps or lumps.  Grayish-white, bad smelling material that drains from the bump or lump. DIAGNOSIS  Epidermal cysts are easily diagnosed by your caregiver during an exam. Rarely, a tissue sample (biopsy) may be taken to rule out other conditions that may resemble epidermal cysts. TREATMENT   Epidermal cysts often get better and disappear on their own. They are rarely ever cancerous.  If a cyst becomes infected, it may become inflamed and tender. This may require opening and draining the cyst. Treatment with antibiotics may be necessary. When the infection is gone, the cyst may be removed with minor  surgery.  Small, inflamed cysts can often be treated with antibiotics or by injecting steroid medicines.  Sometimes, epidermal cysts become large and bothersome. If this happens, surgical removal in your caregiver's office may be necessary. HOME CARE INSTRUCTIONS  Only take over-the-counter or prescription medicines as directed by your caregiver.  Take your antibiotics as directed. Finish them even if you start to feel better. SEEK MEDICAL CARE IF:   Your cyst becomes tender, red, or swollen.  Your condition is not improving or is getting worse.  You have any other questions or concerns. MAKE SURE YOU:  Understand these instructions.  Will watch your condition.  Will get help right away if you are not doing well or get worse.   This information is not intended to replace advice given to you by your health care provider. Make sure you discuss any questions you have with your health care provider.   Document Released: 01/11/2004 Document Revised: 05/04/2011 Document Reviewed: 08/18/2010 Elsevier Interactive Patient Education [employer]2016 [employer] Inc.

## 2015-11-25 NOTE — Assessment & Plan Note (Signed)
A: cyst of face  P: Derm referral for cyst removal Course of bactrim and naproxen for current flare of cyst

## 2015-11-25 NOTE — Progress Notes (Signed)
Pt has lump on left side of face.  Pt declined flu shot.

## 2016-03-31 IMAGING — US US OB DETAIL+14 WK
1 series · 12 of 28 positions shown · non-contrast
Comparison: none

[Series 1: us ob +14 all · 70 acquisitions, 12 frames shown]
[im 3/70]
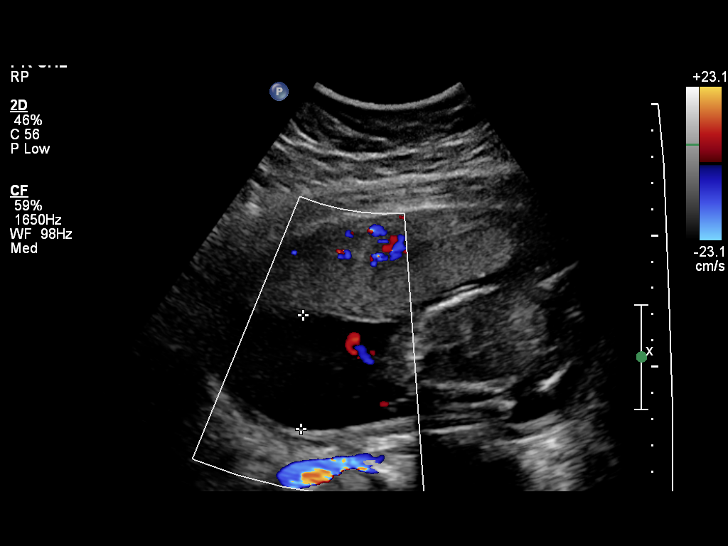
[im 8/70]
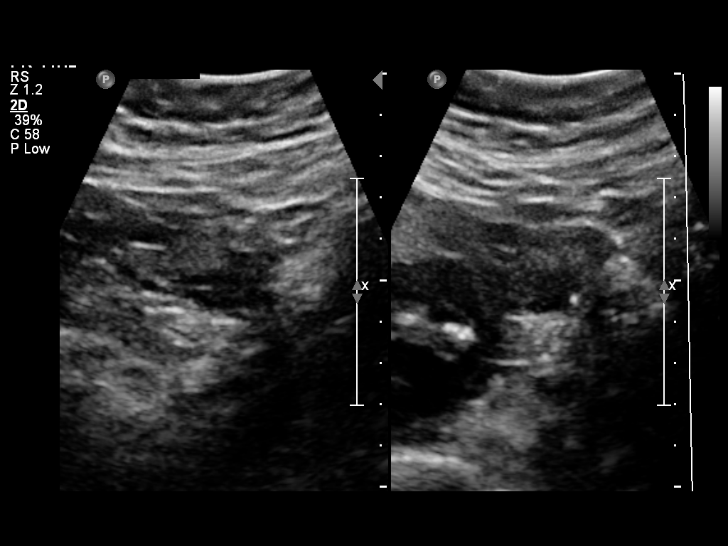
[im 13/70]
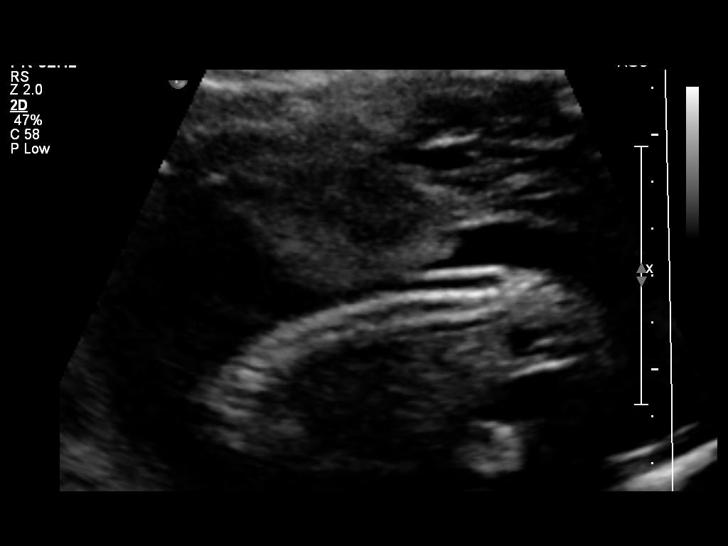
[im 21/70]
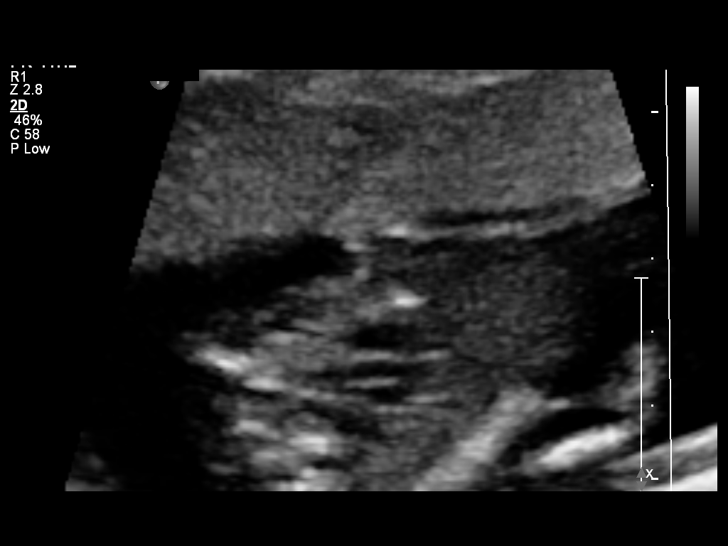
[im 26/70]
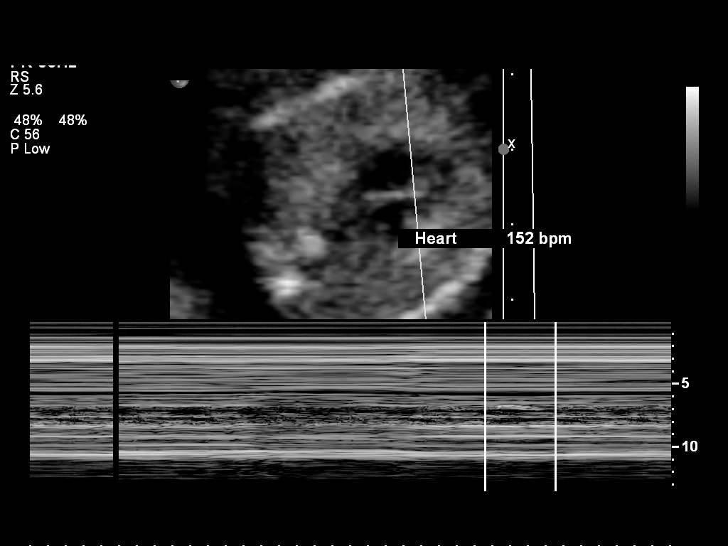
[im 31/70]
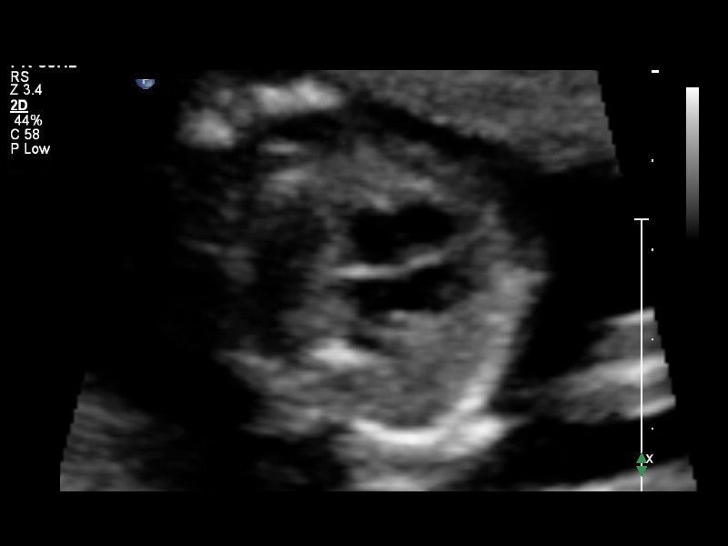
[im 39/70]
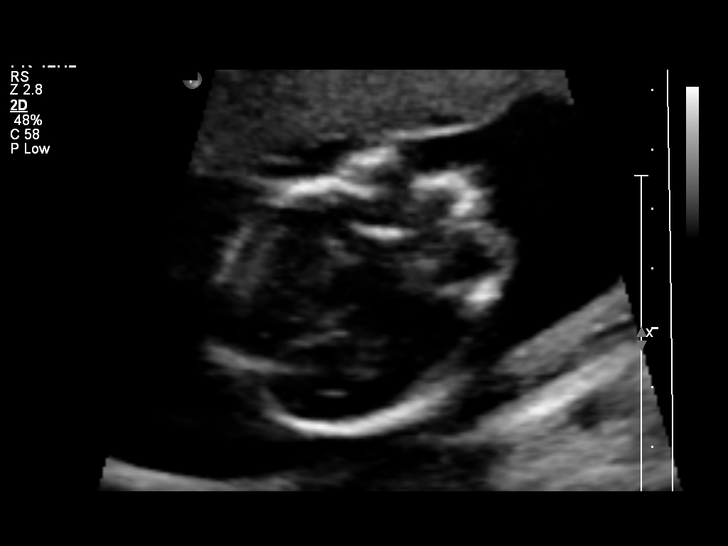
[im 44/70]
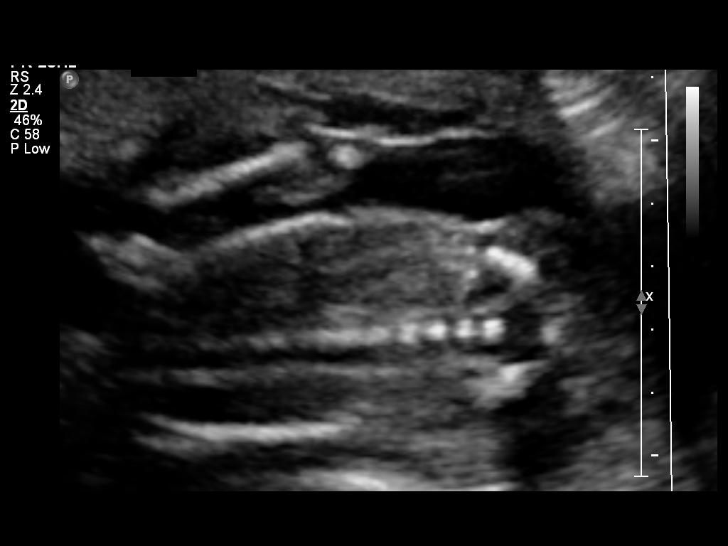
[im 49/70]
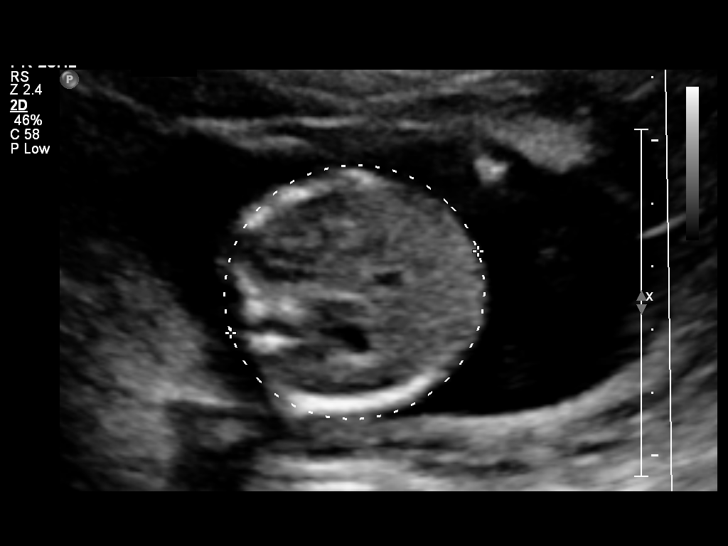
[im 57/70]
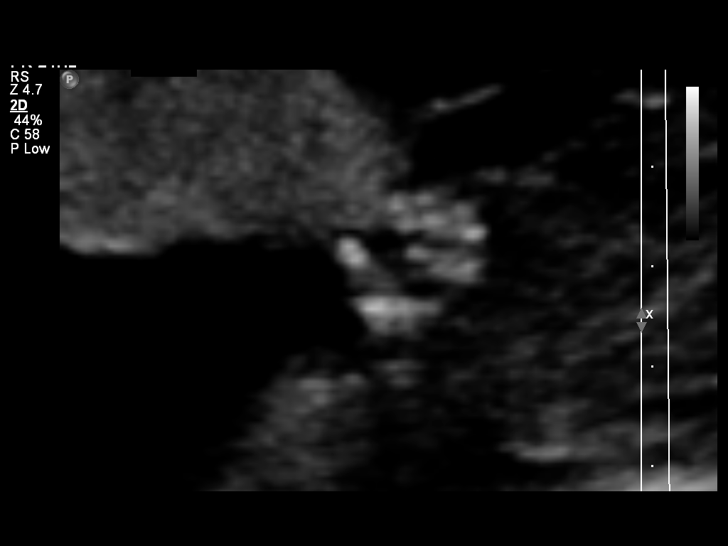
[im 62/70]
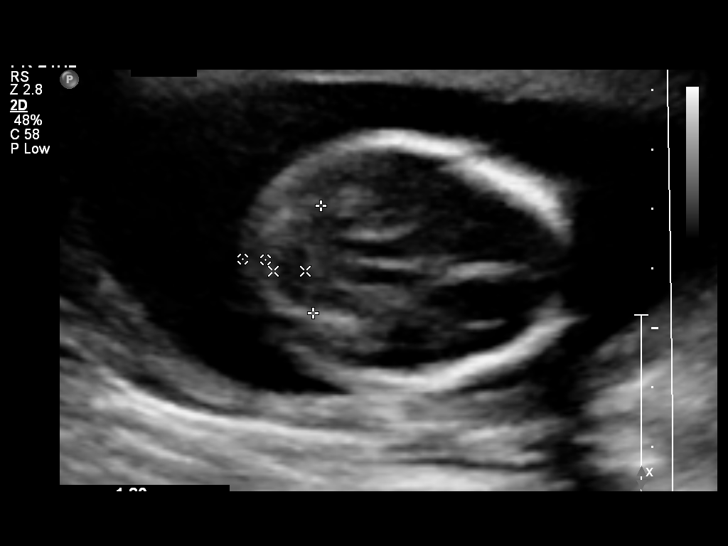
[im 67/70]
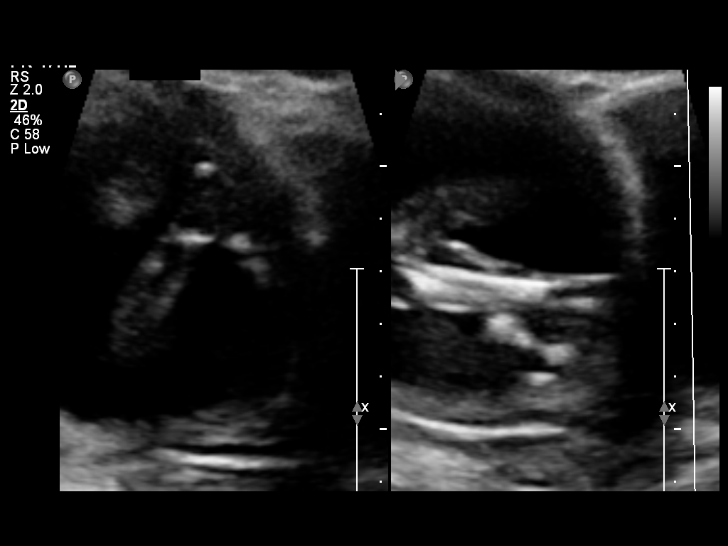

[12 of 28 positions shown; findings below may reference images not displayed]

OBSTETRICS REPORT
(Signed Final 06/27/2014 [DATE])

Service(s) Provided

US OB DETAIL + 14 WK                                  76811.0
Indications

Detailed fetal anatomic survey                        Z36
18 weeks gestation of pregnancy
Obesity complicating pregnancy, second trimester
(BMI 33)
Fetal Evaluation

Num Of Fetuses:    1
Fetal Heart Rate:  152                          bpm
Cardiac Activity:  Observed
Presentation:      Breech
Placenta:          Anterior, above cervical os
P. Cord            Visualized, central
Insertion:

Amniotic Fluid
AFI FV:      Subjectively within normal limits
Larg Pckt:    4.32  cm
Biometry

BPD:     40.6  mm     G. Age:  18w 2d                CI:        65.54   70 - 86
FL/HC:      17.6   16.1 -
18.3
HC:       161  mm     G. Age:  18w 6d       59  %    HC/AC:      1.26   1.09 -
1.39
AC:     127.5  mm     G. Age:  18w 2d       39  %    FL/BPD:
FL:      28.4  mm     G. Age:  18w 5d       49  %    FL/AC:      22.3   20 - 24
HUM:     27.4  mm     G. Age:  18w 5d       58  %
CER:       18  mm     G. Age:  18w 0d       30  %
NFT:     3.81  mm

Est. FW:     245  gm      0 lb 9 oz     45  %
Gestational Age

LMP:           18w 4d        Date:  02/17/14                 EDD:   11/24/14
U/S Today:     18w 4d                                        EDD:   11/24/14
Best:          18w 4d     Det. By:  LMP  (02/17/14)          EDD:   11/24/14
Anatomy

Cranium:          Appears normal         Aortic Arch:      Appears normal
Fetal Cavum:      Appears normal         Ductal Arch:      Appears normal
Ventricles:       Appears normal         Diaphragm:        Appears normal
Choroid Plexus:   Appears normal         Stomach:          Appears normal, left
sided
Cerebellum:       Not well visualized    Abdomen:          Appears normal
Posterior Fossa:  Not well visualized    Abdominal Wall:   Appears nml (cord
insert, abd wall)
Nuchal Fold:      Appears normal         Cord Vessels:     Appears normal (3
vessel cord)
Face:             Appears normal         Kidneys:          Appear normal
(orbits and profile)
Lips:             Not well visualized    Bladder:          Appears normal
Heart:            Appears normal         Spine:            Appears normal
(4CH, axis, and
situs)
RVOT:             Appears normal         Lower             Appears normal
Extremities:
LVOT:             Appears normal         Upper             Appears normal
Extremities:

Other:  Fetus appears to be a female. Heels and 5th digit visualized. Nasal
bone visualized. Technically difficult due to early gestational age and
fetal position.
Targeted Anatomy

Fetal Central Nervous System
Cisterna Magna:
Cervix Uterus Adnexa

Cervical Length:    3.87     cm

Cervix:       Normal appearance by transabdominal scan.
Uterus:       No abnormality visualized.
Cul De Sac:   No free fluid seen.
Left Ovary:    Within normal limits.
Right Ovary:   Within normal limits.

Adnexa:     No abnormality visualized.
Impression

SIUP at 18+4 weeks
Normal detailed fetal anatomy; limited views of PF and lips
Markers of aneuploidy: none
Normal amniotic fluid volume
Measurements consistent with LMP dating
Recommendations

Follow-up as clinically indicated

questions or concerns.

## 2016-07-08 ENCOUNTER — Encounter: Payer: Self-pay | Admitting: Family Medicine

## 2017-09-13 ENCOUNTER — Encounter (HOSPITAL_COMMUNITY): Payer: Self-pay | Admitting: Emergency Medicine

## 2017-09-13 ENCOUNTER — Ambulatory Visit (HOSPITAL_COMMUNITY)
Admission: EM | Admit: 2017-09-13 | Discharge: 2017-09-13 | Disposition: A | Discharge status: Home / Self Care | Payer: BLUE CROSS/BLUE SHIELD | Attending: Family Medicine | Admitting: Family Medicine

## 2017-09-13 DIAGNOSIS — H6002 Abscess of left external ear: Secondary | ICD-10-CM

## 2017-09-13 MED ORDER — ACETAMINOPHEN 325 MG PO TABS
ORAL_TABLET | ORAL | Status: AC
  Filled 2017-09-13: qty 2

## 2017-09-13 MED ORDER — MUPIROCIN CALCIUM 2 % EX CREA: 1.0000 "application " | TOPICAL_CREAM | Freq: Two times a day (BID) | CUTANEOUS | 0 refills | Status: DC

## 2017-09-13 MED ORDER — ACETAMINOPHEN 325 MG PO TABS
650.0000 mg | ORAL_TABLET | Freq: Once | ORAL | Status: AC
  Administered 2017-09-13: 650 mg via ORAL

## 2017-09-13 NOTE — Discharge Instructions (Addendum)
It was nice meeting you!!  I drained the abscess in your ear.  Tylenol for pain.  Mupirocin ointment for antibiotic coverage. Return here as needed.

## 2017-09-13 NOTE — ED Triage Notes (Signed)
PT thinks she has an abscess in her ear that is causing facial pain as well. PT is [redacted] weeks pregnant.

## 2017-09-13 NOTE — ED Provider Notes (Signed)
[facility]    CSN: [id number] Arrival date & time: 09/13/17  1543     History   Chief Complaint Chief Complaint  Patient presents with  . Otalgia    HPI Rachel Barrera is a 27 y.o. female.   Patient is a healthy 27 year old female that presents with abscess in left ear canal.  This is been there for about 1 week but worsening over the past 3 days getting more painful and swollen.  She has been using witch hazel and Tylenol with some relief.  She denies any drainage.  She denies any fever, chills, fatigue, body aches, nausea.  ROS per HPI      Past Medical History:  Diagnosis Date  . Abnormal Pap smear 2010   cervical biopsy  . Vaginal Pap smear, abnormal     Patient Active Problem List   Diagnosis Date Noted  . Epidermal cyst of face 11/25/2015  . 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 NO PAST SURGERIES      OB History    Gravida  2   Para  1   Term  1   Preterm  0   AB  0   Living  1     SAB  0   TAB  0   Ectopic  0   Multiple  0   Live Births  1            Home Medications    Prior to Admission medications   Medication Sig Start Date End Date Taking? Authorizing Provider  mupirocin cream (BACTROBAN) 2 % Apply 1 application topically 2 (two) times daily. 09/13/17   Dahlia ByesBast, Azlin Zilberman A, NP  naproxen (NAPROSYN) 500 MG tablet Take 1 tablet (500 mg total) by mouth 2 (two) times daily with a meal. 11/25/15   Funches, Josalyn, MD  sulfamethoxazole-trimethoprim (BACTRIM) 400-80 MG tablet Take 1 tablet by mouth 2 (two) times daily. 11/25/15   Dessa PhiFunches, Josalyn, MD    Family History Family History  Problem Relation Age of Onset  . Hypertension Mother   . Anesthesia problems Neg Hx     Social History Social History   Tobacco Use  . Smoking status: Former Smoker    Types: Cigarettes    Last attempt to quit: 12/31/2013    Years since quitting: 3.7  . Smokeless tobacco: Never Used    Substance Use Topics  . Alcohol use: Yes    Comment: stopped when found out pregnant-was drinking 3-4 drinks per week  . Drug use: No     Allergies   Patient has no known allergies.   Review of Systems Review of Systems   Physical Exam Triage Vital Signs ED Triage Vitals  Enc Vitals Group     BP 09/13/17 1607 122/76     Pulse Rate 09/13/17 1607 90     Resp 09/13/17 1607 16     Temp 09/13/17 1607 98.7 F (37.1 C)     Temp Source 09/13/17 1607 Oral     SpO2 09/13/17 1607 100 %     Weight --      Height --      Head Circumference --      Peak Flow --      Pain Score 09/13/17 1610 7     Pain Loc --      Pain Edu? --      Excl. in GC? --    No data found.  Updated Vital Signs BP 122/76 (BP Location: Left Arm)   Pulse 90   Temp 98.7 F (37.1 C) (Oral)   Resp 16   SpO2 100%   Visual Acuity Right Eye Distance:   Left Eye Distance:   Bilateral Distance:    Right Eye Near:   Left Eye Near:    Bilateral Near:     Physical Exam  Constitutional: She is oriented to person, place, and time. She appears well-developed and well-nourished.  HENT:  Head: Normocephalic and atraumatic.    Neck: Normal range of motion.  Pulmonary/Chest: Effort normal.  Lymphadenopathy:    She has no cervical adenopathy.  Neurological: She is alert and oriented to person, place, and time.  Skin: Skin is warm and dry. Capillary refill takes less than 2 seconds.  approx 1/2 cm by 1/2 cm pea sized abscess inside left external ear canal posterior to tragus. Tender to touch. No erythema.   Psychiatric: She has a normal mood and affect.     UC Treatments / Results  Labs (all labs ordered are listed, but only abnormal results are displayed) Labs Reviewed - No data to display  EKG None  Radiology No results found.  Procedures Incision and Drainage Date/Time: 09/13/2017 5:12 PM Performed by: Janace Aris, NP Authorized by: Mardella Layman, MD   Consent:    Consent obtained:   Verbal   Consent given by:  Patient   Risks discussed:  Bleeding, incomplete drainage and pain Location:    Type:  Abscess   Location:  Head   Head location:  L external ear Pre-procedure details:    Skin preparation:  Betadine Anesthesia (see MAR for exact dosages):    Anesthesia method:  None Procedure type:    Complexity:  Simple Procedure details:    Needle aspiration: no     Incision types:  Stab incision   Incision depth:  Subcutaneous   Scalpel size: 18g needle.   Drainage:  Bloody and purulent   Drainage amount:  Moderate   Wound treatment:  Wound left open   Packing materials:  None Post-procedure details:    Patient tolerance of procedure:  Tolerated well, no immediate complications   (including critical care time)  Medications Ordered in UC Medications  acetaminophen (TYLENOL) tablet 650 mg (650 mg Oral Given 09/13/17 1700)    Initial Impression / Assessment and Plan / UC Course  I have reviewed the triage vital signs and the nursing notes.  Pertinent labs & imaging results that were available during my care of the patient were reviewed by me and considered in my medical decision making (see chart for details).     Stab incision of abscess to left ear canal with moderate amount of drainage.  Bacitracin ointment and cottonball for drainage.  Prescription for mupirocin for antibiotic coverage.  Follow-up as needed. Final Clinical Impressions(s) / UC Diagnoses   Final diagnoses:  Abscess of left ear canal     Discharge Instructions     It was nice meeting you!!  I drained the abscess in your ear.  Tylenol for pain.  Mupirocin ointment for antibiotic coverage. Return here as needed.     ED Prescriptions    Medication Sig Dispense Auth. Provider   mupirocin cream (BACTROBAN) 2 % Apply 1 application topically 2 (two) times daily. 15 g Dahlia Byes A, NP     Controlled Substance Prescriptions [place] Controlled Substance Registry consulted? Not Applicable     Janace Aris, NP 09/13/17 1714

## 2017-10-11 ENCOUNTER — Encounter: Payer: BLUE CROSS/BLUE SHIELD | Admitting: Advanced Practice Midwife

## 2017-12-28 ENCOUNTER — Encounter (HOSPITAL_COMMUNITY): Payer: Self-pay

## 2017-12-28 ENCOUNTER — Ambulatory Visit (HOSPITAL_COMMUNITY)
Admission: EM | Admit: 2017-12-28 | Discharge: 2017-12-28 | Disposition: A | Discharge status: Home / Self Care | Payer: BLUE CROSS/BLUE SHIELD | Attending: Family Medicine | Admitting: Family Medicine

## 2017-12-28 ENCOUNTER — Other Ambulatory Visit: Payer: Self-pay

## 2017-12-28 DIAGNOSIS — Z331 Pregnant state, incidental: Secondary | ICD-10-CM

## 2017-12-28 DIAGNOSIS — L0291 Cutaneous abscess, unspecified: Secondary | ICD-10-CM

## 2017-12-28 DIAGNOSIS — L0211 Cutaneous abscess of neck: Secondary | ICD-10-CM

## 2017-12-28 MED ORDER — MUPIROCIN CALCIUM 2 % EX CREA: 1.0000 "application " | TOPICAL_CREAM | Freq: Two times a day (BID) | CUTANEOUS | 0 refills | Status: DC

## 2017-12-28 MED ORDER — CEPHALEXIN 500 MG PO CAPS: 500.0000 mg | ORAL_CAPSULE | Freq: Three times a day (TID) | ORAL | 0 refills | Status: DC

## 2017-12-28 NOTE — Discharge Instructions (Signed)
Wash the abscess area twice daily with soap and water.  Return for continued swelling or pain.

## 2017-12-28 NOTE — ED Triage Notes (Signed)
Pt cc neck abscess x 1 week or more. Pt states she's 5 months pregnant. Pt states she was put on antibiotic for it already.

## 2017-12-28 NOTE — ED Provider Notes (Signed)
[facility]    CSN: [id number] Arrival date & time: 12/28/17  1524     History   Chief Complaint Chief Complaint  Patient presents with  . Abscess    HPI Rachel Barrera is a 27 y.o. female.   This is a 27 year old woman comes in with what she believes is an abscess on her neck.  She is 5 months pregnant.  She has had the left neck swelling for over a month.  Her Ob put her on Cephalexin a month ago for an abscess on her left cheek which was drained as well.  The abscess drained some this morning.     Past Medical History:  Diagnosis Date  . Abnormal Pap smear 2010   cervical biopsy  . Vaginal Pap smear, abnormal     Patient Active Problem List   Diagnosis Date Noted  . Epidermal cyst of face 11/25/2015  . 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 NO PAST SURGERIES      OB History    Gravida  2   Para  1   Term  1   Preterm  0   AB  0   Living  1     SAB  0   TAB  0   Ectopic  0   Multiple  0   Live Births  1            Home Medications    Prior to Admission medications   Medication Sig Start Date End Date Taking? Authorizing Provider  cephALEXin (KEFLEX) 500 MG capsule Take 1 capsule (500 mg total) by mouth 3 (three) times daily. 12/28/17   Elvina Sidle, MD  mupirocin cream (BACTROBAN) 2 % Apply 1 application topically 2 (two) times daily. 12/28/17   Elvina Sidle, MD  naproxen (NAPROSYN) 500 MG tablet Take 1 tablet (500 mg total) by mouth 2 (two) times daily with a meal. 11/25/15   Dessa Phi, MD    Family History Family History  Problem Relation Age of Onset  . Hypertension Mother   . Anesthesia problems Neg Hx     Social History Social History   Tobacco Use  . Smoking status: Former Smoker    Types: Cigarettes    Last attempt to quit: 12/31/2013    Years since quitting: 3.9  . Smokeless tobacco: Never Used  Substance Use Topics  . Alcohol use: Yes   Comment: stopped when found out pregnant-was drinking 3-4 drinks per week  . Drug use: No     Allergies   Patient has no known allergies.   Review of Systems Review of Systems   Physical Exam Triage Vital Signs ED Triage Vitals  Enc Vitals Group     BP 12/28/17 1640 115/68     Pulse Rate 12/28/17 1640 94     Resp 12/28/17 1640 18     Temp 12/28/17 1640 98.5 F (36.9 C)     Temp src --      SpO2 12/28/17 1640 100 %     Weight 12/28/17 1641 223 lb (101.2 kg)     Height --      Head Circumference --      Peak Flow --      Pain Score 12/28/17 1641 5     Pain Loc --      Pain Edu? --      Excl. in GC? --    No data found.  Updated Vital Signs BP 115/68 (BP Location: Left Arm)   Pulse 94   Temp 98.5 F (36.9 C)   Resp 18   Wt 101.2 kg   SpO2 100%   BMI 39.50 kg/m    Physical Exam  Constitutional: She is oriented to person, place, and time. She appears well-developed and well-nourished.  HENT:  Right Ear: External ear normal.  Left Ear: External ear normal.  Mouth/Throat: Oropharynx is clear and moist.  Eyes: Conjunctivae are normal.  Neck: Normal range of motion. Neck supple.  Pulmonary/Chest: Effort normal.  Musculoskeletal: Normal range of motion.  Neurological: She is alert and oriented to person, place, and time.  Skin: Skin is warm and dry.  1 cm fluctuant subcutaneous cyst left anterior neck  Nursing note and vitals reviewed.    UC Treatments / Results  Labs (all labs ordered are listed, but only abnormal results are displayed) Labs Reviewed - No data to display  EKG None  Radiology No results found.  Procedures Incision and Drainage Date/Time: 12/28/2017 5:12 PM Performed by: Elvina Sidle, MD Authorized by: Elvina Sidle, MD   Consent:    Consent obtained:  Verbal   Consent given by:  Patient   Risks discussed:  Incomplete drainage and infection   Alternatives discussed:  No treatment Location:    Type:  Abscess    Location:  Neck   Neck location:  L anterior Pre-procedure details:    Skin preparation:  Antiseptic wash Anesthesia (see MAR for exact dosages):    Anesthesia method:  Local infiltration   Local anesthetic:  Lidocaine 2% w/o epi Procedure type:    Complexity:  Simple Procedure details:    Needle aspiration: no     Incision types:  Stab incision   Incision depth:  Subcutaneous   Scalpel blade:  15   Wound management:  Probed and deloculated   Drainage:  Purulent and bloody   Drainage amount:  Moderate   Wound treatment:  Wound left open   Packing materials:  None Post-procedure details:    Patient tolerance of procedure:  Tolerated well, no immediate complications   (including critical care time)  Medications Ordered in UC Medications - No data to display  Initial Impression / Assessment and Plan / UC Course  I have reviewed the triage vital signs and the nursing notes.  Pertinent labs & imaging results that were available during my care of the patient were reviewed by me and considered in my medical decision making (see chart for details).    Final Clinical Impressions(s) / UC Diagnoses   Final diagnoses:  Abscess     Discharge Instructions     Wash the abscess area twice daily with soap and water.  Return for continued swelling or pain.    ED Prescriptions    Medication Sig Dispense Auth. Provider   mupirocin cream (BACTROBAN) 2 % Apply 1 application topically 2 (two) times daily. 15 g Elvina Sidle, MD   cephALEXin (KEFLEX) 500 MG capsule Take 1 capsule (500 mg total) by mouth 3 (three) times daily. 20 capsule Elvina Sidle, MD     Controlled Substance Prescriptions [place] Controlled Substance Registry consulted? Not Applicable   Elvina Sidle, MD 12/28/17 [phone]

## 2018-01-11 ENCOUNTER — Encounter (HOSPITAL_COMMUNITY): Payer: Self-pay | Admitting: Emergency Medicine

## 2018-01-11 ENCOUNTER — Ambulatory Visit (HOSPITAL_COMMUNITY)
Admission: EM | Admit: 2018-01-11 | Discharge: 2018-01-11 | Disposition: A | Discharge status: Home / Self Care | Payer: Medicaid Other | Attending: Family Medicine | Admitting: Family Medicine

## 2018-01-11 DIAGNOSIS — Z331 Pregnant state, incidental: Secondary | ICD-10-CM

## 2018-01-11 DIAGNOSIS — Z5189 Encounter for other specified aftercare: Secondary | ICD-10-CM

## 2018-01-11 DIAGNOSIS — L0211 Cutaneous abscess of neck: Secondary | ICD-10-CM

## 2018-01-11 DIAGNOSIS — R234 Changes in skin texture: Secondary | ICD-10-CM

## 2018-01-11 DIAGNOSIS — R221 Localized swelling, mass and lump, neck: Secondary | ICD-10-CM | POA: Diagnosis not present

## 2018-01-11 NOTE — ED Triage Notes (Signed)
Pt here for wound check for abscess to neck; pt sts some increased swelling; pt still taking antibiotics

## 2018-01-11 NOTE — ED Provider Notes (Signed)
[facility]   [id number] 01/11/18 Arrival Time: 1011  ASSESSMENT & PLAN:  1. Visit for wound check   2. Thickening of skin    Discussed opening this area again here vs seeing ENT. I recommend that she see ENT. Letter given.  Follow-up Information    Schedule an appointment as soon as possible for a visit  [facility].   Contact information: [address] [place] [address] [phone]          May f/u here if needed.  Reviewed expectations re: course of current medical issues. Questions answered. Outlined signs and symptoms indicating need for more acute intervention. Patient verbalized understanding. After Visit Summary given.   SUBJECTIVE: History from: patient. Rachel Barrera is a 27 y.o. female who presents for follow up s/p I&D of L neck abscess on 12/28/2017. Still taking antibiotic. [redacted] weeks pregnant. Reports area over healed I&D site has remained thickened. No pain reported. No change in size. Occasionally finds a whitehead near area; opens with "pus" then resolves. No redness around I&D site. Afebrile.  ROS: As per HPI.   OBJECTIVE:  Vitals:   01/11/18 1140  BP: (!) 101/58  Pulse: 93  Resp: 18  Temp: 97.9 F (36.6 C)  TempSrc: Oral  SpO2: 100%    General appearance: alert; no distress Neck: supple with FROM; I&D site of L neck is healed; approx 1x0.5cm linear area of skin thickening without erythema; feels firm without fluctuance; normal surrounding skin; no LAD Lungs: clear to auscultation bilaterally; unlabored Heart: regular rate and rhythm without murmer Extremities: no edema; symmetrical with no gross deformities Skin: warm and dry Psychological: alert and cooperative; normal mood and affect  No Known Allergies  Past Medical History:  Diagnosis Date  . Abnormal Pap smear 2010   cervical biopsy  . Vaginal Pap smear, abnormal    Social History   Socioeconomic History  .  Marital status: Single    Spouse name: Not on file  . Number of children: Not on file  . Years of education: Not on file  . Highest education level: Not on file  Occupational History  . Not on file  Social Needs  . Financial resource strain: Not on file  . Food insecurity:    Worry: Not on file    Inability: Not on file  . Transportation needs:    Medical: Not on file    Non-medical: Not on file  Tobacco Use  . Smoking status: Former Smoker    Types: Cigarettes    Last attempt to quit: 12/31/2013    Years since quitting: 4.0  . Smokeless tobacco: Never Used  Substance and Sexual Activity  . Alcohol use: Yes    Comment: stopped when found out pregnant-was drinking 3-4 drinks per week  . Drug use: No  . Sexual activity: Not Currently    Birth control/protection: Condom  Lifestyle  . Physical activity:    Days per week: Not on file    Minutes per session: Not on file  . Stress: Not on file  Relationships  . Social connections:    Talks on phone: Not on file    Gets together: Not on file    Attends religious service: Not on file    Active member of club or organization: Not on file    Attends meetings of clubs or organizations: Not on file    Relationship status: Not on file  . Intimate partner  violence:    Fear of current or ex partner: Not on file    Emotionally abused: Not on file    Physically abused: Not on file    Forced sexual activity: Not on file  Other Topics Concern  . Not on file  Social History Narrative  . Not on file   Family History  Problem Relation Age of Onset  . Hypertension Mother   . Anesthesia problems Neg Hx    Past Surgical History:  Procedure Laterality Date  . NO PAST SURGERIES       Mardella Layman, MD 01/11/18 1222

## 2018-02-05 ENCOUNTER — Encounter (HOSPITAL_COMMUNITY): Payer: Self-pay | Admitting: Emergency Medicine

## 2018-02-05 ENCOUNTER — Ambulatory Visit (HOSPITAL_COMMUNITY)
Admission: EM | Admit: 2018-02-05 | Discharge: 2018-02-05 | Disposition: A | Discharge status: Home / Self Care | Payer: Medicaid Other | Attending: Emergency Medicine | Admitting: Emergency Medicine

## 2018-02-05 DIAGNOSIS — O99513 Diseases of the respiratory system complicating pregnancy, third trimester: Secondary | ICD-10-CM | POA: Diagnosis not present

## 2018-02-05 DIAGNOSIS — Z87891 Personal history of nicotine dependence: Secondary | ICD-10-CM | POA: Diagnosis not present

## 2018-02-05 DIAGNOSIS — Z3A28 28 weeks gestation of pregnancy: Secondary | ICD-10-CM | POA: Insufficient documentation

## 2018-02-05 DIAGNOSIS — J029 Acute pharyngitis, unspecified: Secondary | ICD-10-CM | POA: Insufficient documentation

## 2018-02-05 DIAGNOSIS — O26893 Other specified pregnancy related conditions, third trimester: Secondary | ICD-10-CM | POA: Insufficient documentation

## 2018-02-05 DIAGNOSIS — R0982 Postnasal drip: Secondary | ICD-10-CM | POA: Insufficient documentation

## 2018-02-05 DIAGNOSIS — Z8249 Family history of ischemic heart disease and other diseases of the circulatory system: Secondary | ICD-10-CM | POA: Insufficient documentation

## 2018-02-05 LAB — POCT RAPID STREP A: Streptococcus, Group A Screen (Direct): NEGATIVE

## 2018-02-05 MED ORDER — CETIRIZINE HCL 10 MG PO CAPS: 10.0000 mg | ORAL_CAPSULE | Freq: Every day | ORAL | 0 refills | Status: AC

## 2018-02-05 NOTE — ED Provider Notes (Signed)
[facility]    CSN: [id number] Arrival date & time: 02/05/18  1032     History   Chief Complaint Chief Complaint  Patient presents with  . Congestion  . Sore Throat    HPI Calin Ellery is a 27 y.o. female approximately [redacted] weeks pregnant presenting today for evaluation of nasal congestion, drainage and sore throat.  Symptoms began Thursday, 3 days ago and worsened last night.  She states that throughout the night she was having balls of mucus in her throat that she would have to sit up and clear.  She has had mild cough associated with this, but main concern is congestion and sore throat.  She wants to make sure that she does not have strep throat.  She denies any fevers.  Denies nausea vomiting or abdominal pain.  She has tried using tea but has not taken any over-the-counter medicines.  HPI  Past Medical History:  Diagnosis Date  . Abnormal Pap smear 2010   cervical biopsy  . Vaginal Pap smear, abnormal     Patient Active Problem List   Diagnosis Date Noted  . Epidermal cyst of face 11/25/2015  . 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 NO PAST SURGERIES      OB History    Gravida  2   Para  1   Term  1   Preterm  0   AB  0   Living  1     SAB  0   TAB  0   Ectopic  0   Multiple  0   Live Births  1            Home Medications    Prior to Admission medications   Medication Sig Start Date End Date Taking? Authorizing Provider  nitrofurantoin (MACRODANTIN) 100 MG capsule Take 100 mg by mouth daily.   Yes [provider]  Cetirizine HCl 10 MG CAPS Take 1 capsule (10 mg total) by mouth daily. 02/05/18   Jazlynne Milliner, Junius Creamer, PA-C    Family History Family History  Problem Relation Age of Onset  . Hypertension Mother   . Anesthesia problems Neg Hx     Social History Social History   Tobacco Use  . Smoking status: Former Smoker    Types: Cigarettes    Last attempt to quit:  12/31/2013    Years since quitting: 4.1  . Smokeless tobacco: Never Used  Substance Use Topics  . Alcohol use: Yes    Comment: stopped when found out pregnant-was drinking 3-4 drinks per week  . Drug use: No     Allergies   Patient has no known allergies.   Review of Systems Review of Systems  Constitutional: Negative for activity change, appetite change, chills, fatigue and fever.  HENT: Positive for congestion, rhinorrhea and sore throat. Negative for ear pain, sinus pressure and trouble swallowing.   Eyes: Negative for discharge and redness.  Respiratory: Positive for cough. Negative for chest tightness and shortness of breath.   Cardiovascular: Negative for chest pain.  Gastrointestinal: Negative for abdominal pain, diarrhea, nausea and vomiting.  Musculoskeletal: Negative for myalgias.  Skin: Negative for rash.  Neurological: Negative for dizziness, light-headedness and headaches.     Physical Exam Triage Vital Signs ED Triage Vitals  Enc Vitals Group     BP 02/05/18 1124 117/71     Pulse Rate 02/05/18 1124 (!) 105     Resp 02/05/18 1124  18     Temp 02/05/18 1124 98.4 F (36.9 C)     Temp src --      SpO2 02/05/18 1124 98 %     Weight --      Height --      Head Circumference --      Peak Flow --      Pain Score 02/05/18 1125 7     Pain Loc --      Pain Edu? --      Excl. in GC? --    No data found.  Updated Vital Signs BP 117/71   Pulse (!) 105   Temp 98.4 F (36.9 C)   Resp 18   SpO2 98%   Visual Acuity Right Eye Distance:   Left Eye Distance:   Bilateral Distance:    Right Eye Near:   Left Eye Near:    Bilateral Near:     Physical Exam Vitals signs and nursing note reviewed.  Constitutional:      General: She is not in acute distress.    Appearance: She is well-developed.  HENT:     Head: Normocephalic and atraumatic.     Ears:     Comments: Bilateral ears without tenderness to palpation of external auricle, tragus and mastoid, EAC's  without erythema or swelling, TM's with good bony landmarks and cone of light. Non erythematous.     Nose:     Comments: Swollen nasal turbinates, slightly erythematous    Mouth/Throat:     Comments: Oral mucosa pink and moist, no tonsillar enlargement or exudate. Posterior pharynx patent and erythematous, no uvula deviation or swelling. Normal phonation. Eyes:     Conjunctiva/sclera: Conjunctivae normal.  Neck:     Musculoskeletal: Neck supple.  Cardiovascular:     Rate and Rhythm: Regular rhythm. Tachycardia present.     Heart sounds: No murmur.  Pulmonary:     Effort: Pulmonary effort is normal. No respiratory distress.     Breath sounds: Normal breath sounds.     Comments: Breathing comfortably at rest, CTABL, no wheezing, rales or other adventitious sounds auscultated Abdominal:     Palpations: Abdomen is soft.     Tenderness: There is no abdominal tenderness.  Skin:    General: Skin is warm and dry.  Neurological:     Mental Status: She is alert.      UC Treatments / Results  Labs (all labs ordered are listed, but only abnormal results are displayed) Labs Reviewed  CULTURE, GROUP A STREP [facility])    EKG None  Radiology No results found.  Procedures Procedures (including critical care time)  Medications Ordered in UC Medications - No data to display  Initial Impression / Assessment and Plan / UC Course  I have reviewed the triage vital signs and the nursing notes.  Pertinent labs & imaging results that were available during my care of the patient were reviewed by me and considered in my medical decision making (see chart for details).     Strep test negative.  Likely viral versus postnasal drainage.  Patient is tachycardic, but likely related to pregnancy.  Provided list of medicines that are safe in pregnancy.  Recommended daily Zyrtec.  Discussed further symptomatic recommendations.  Advised against ibuprofen and to mainly use Tylenol.Discussed strict return  precautions. Patient verbalized understanding and is agreeable with plan.  Final Clinical Impressions(s) / UC Diagnoses   Final diagnoses:  Sore throat  Post-nasal drainage     Discharge Instructions  Sore Throat  Your rapid strep tested Negative today. We will send for a culture and call in about 2 days if results are positive. For now we will treat your sore throat as a virus with symptom management.   Please continue Tylenol or Ibuprofen for fever and pain. May try salt water gargles, cepacol lozenges, throat spray, or OTC cold relief medicine for throat discomfort. If you also have congestion take a daily anti-histamine like Zyrtec, Claritin, and a oral decongestant to help with post nasal drip that may be irritating your throat.   Stay hydrated and drink plenty of fluids to keep your throat coated relieve irritation.     ED Prescriptions    Medication Sig Dispense Auth. Provider   Cetirizine HCl 10 MG CAPS Take 1 capsule (10 mg total) by mouth daily. 15 capsule Melvie Paglia C, PA-C     Controlled Substance Prescriptions [place] Controlled Substance Registry consulted? Not Applicable   Lew DawesWieters, Mone Commisso C, [place]-C 02/05/18 1148

## 2018-02-05 NOTE — ED Triage Notes (Signed)
Pt c/o sore throat, mucous, states its worse at night with a lot of post nasal drip. Pt states shes [redacted] weeks pregnant.

## 2018-02-05 NOTE — Discharge Instructions (Addendum)

## 2018-02-07 LAB — CULTURE, GROUP A STREP (THRC)

## 2018-02-23 NOTE — L&D Delivery Note (Signed)
Delivery Note At 10:16 AM a viable female was delivered via Vaginal, Spontaneous (Presentation: LOA).  APGAR: 8, 9; weight pending.   Placenta status: delivered spontaneously and completely. Cord:  Three vessels with the following complications: n/a.  Anesthesia:  Epidural Episiotomy: None Lacerations:  1st degree Suture Repair: 3.0 vicryl Est. Blood Loss (mL): 287  Mom to postpartum.  Baby to Couplet care / Skin to Skin.  Janeece Riggers 04/28/2018, 10:43 AM

## 2018-04-28 ENCOUNTER — Inpatient Hospital Stay (HOSPITAL_COMMUNITY): Payer: Medicaid Other | Admitting: Anesthesiology

## 2018-04-28 ENCOUNTER — Encounter (HOSPITAL_COMMUNITY): Payer: Self-pay

## 2018-04-28 ENCOUNTER — Inpatient Hospital Stay (HOSPITAL_COMMUNITY)
Admission: AD | Admit: 2018-04-28 | Discharge: 2018-04-30 | DRG: 807 | Disposition: A | Discharge status: Home / Self Care | Payer: Medicaid Other | Attending: Obstetrics & Gynecology | Admitting: Obstetrics & Gynecology

## 2018-04-28 DIAGNOSIS — O99824 Streptococcus B carrier state complicating childbirth: Secondary | ICD-10-CM | POA: Diagnosis present

## 2018-04-28 DIAGNOSIS — O26893 Other specified pregnancy related conditions, third trimester: Secondary | ICD-10-CM | POA: Diagnosis present

## 2018-04-28 DIAGNOSIS — Z87891 Personal history of nicotine dependence: Secondary | ICD-10-CM

## 2018-04-28 DIAGNOSIS — Z3A39 39 weeks gestation of pregnancy: Secondary | ICD-10-CM | POA: Diagnosis not present

## 2018-04-28 DIAGNOSIS — Z87440 Personal history of urinary (tract) infections: Secondary | ICD-10-CM | POA: Diagnosis not present

## 2018-04-28 LAB — CBC
HCT: 38.3 % (ref 36.0–46.0)
Hemoglobin: 12.3 g/dL (ref 12.0–15.0)
MCH: 29.5 pg (ref 26.0–34.0)
MCHC: 32.1 g/dL (ref 30.0–36.0)
MCV: 91.8 fL (ref 80.0–100.0)
NRBC: 0 % (ref 0.0–0.2)
Platelets: 212 10*3/uL (ref 150–400)
RBC: 4.17 MIL/uL (ref 3.87–5.11)
RDW: 13.3 % (ref 11.5–15.5)
WBC: 11.4 10*3/uL — ABNORMAL HIGH (ref 4.0–10.5)

## 2018-04-28 LAB — TYPE AND SCREEN
ABO/RH(D): O POS
Antibody Screen: NEGATIVE

## 2018-04-28 LAB — RPR: RPR: NONREACTIVE

## 2018-04-28 LAB — ABO/RH: ABO/RH(D): O POS

## 2018-04-28 MED ORDER — FENTANYL-BUPIVACAINE-NACL 0.5-0.125-0.9 MG/250ML-% EP SOLN
12.0000 mL/h | EPIDURAL | Status: DC | PRN
  Filled 2018-04-28: qty 250

## 2018-04-28 MED ORDER — BENZOCAINE-MENTHOL 20-0.5 % EX AERO
1.0000 "application " | INHALATION_SPRAY | CUTANEOUS | Status: DC | PRN
  Administered 2018-04-28 – 2018-04-30 (×2): 1 via TOPICAL
  Filled 2018-04-28 (×2): qty 56

## 2018-04-28 MED ORDER — PRENATAL MULTIVITAMIN CH
1.0000 | ORAL_TABLET | Freq: Every day | ORAL | Status: DC
  Administered 2018-04-29 – 2018-04-30 (×2): 1 via ORAL
  Filled 2018-04-28 (×2): qty 1

## 2018-04-28 MED ORDER — TETANUS-DIPHTH-ACELL PERTUSSIS 5-2.5-18.5 LF-MCG/0.5 IM SUSP: 0.5000 mL | Freq: Once | INTRAMUSCULAR | Status: DC

## 2018-04-28 MED ORDER — LACTATED RINGERS IV SOLN
500.0000 mL | Freq: Once | INTRAVENOUS | Status: AC
  Administered 2018-04-28: 500 mL via INTRAVENOUS

## 2018-04-28 MED ORDER — SODIUM CHLORIDE (PF) 0.9 % IJ SOLN
INTRAMUSCULAR | Status: DC | PRN
  Administered 2018-04-28: 12 mL/h via EPIDURAL

## 2018-04-28 MED ORDER — IBUPROFEN 600 MG PO TABS
600.0000 mg | ORAL_TABLET | Freq: Four times a day (QID) | ORAL | Status: DC
  Administered 2018-04-28 – 2018-04-30 (×9): 600 mg via ORAL
  Filled 2018-04-28 (×8): qty 1

## 2018-04-28 MED ORDER — COCONUT OIL OIL
1.0000 "application " | TOPICAL_OIL | Status: DC | PRN
  Administered 2018-04-29: 1 via TOPICAL

## 2018-04-28 MED ORDER — OXYCODONE-ACETAMINOPHEN 5-325 MG PO TABS: 1.0000 | ORAL_TABLET | ORAL | Status: DC | PRN

## 2018-04-28 MED ORDER — ONDANSETRON HCL 4 MG/2ML IJ SOLN: 4.0000 mg | INTRAMUSCULAR | Status: DC | PRN

## 2018-04-28 MED ORDER — LIDOCAINE-EPINEPHRINE (PF) 2 %-1:200000 IJ SOLN
INTRAMUSCULAR | Status: DC | PRN
  Administered 2018-04-28: 3 mL via EPIDURAL
  Administered 2018-04-28: 5 mL via EPIDURAL

## 2018-04-28 MED ORDER — SIMETHICONE 80 MG PO CHEW: 80.0000 mg | CHEWABLE_TABLET | ORAL | Status: DC | PRN

## 2018-04-28 MED ORDER — OXYTOCIN BOLUS FROM INFUSION
500.0000 mL | Freq: Once | INTRAVENOUS | Status: AC
  Administered 2018-04-28: 500 mL via INTRAVENOUS

## 2018-04-28 MED ORDER — EPHEDRINE 5 MG/ML INJ: 10.0000 mg | INTRAVENOUS | Status: DC | PRN

## 2018-04-28 MED ORDER — FENTANYL CITRATE (PF) 100 MCG/2ML IJ SOLN
50.0000 ug | INTRAMUSCULAR | Status: DC | PRN
  Administered 2018-04-28: 100 ug via INTRAVENOUS
  Filled 2018-04-28: qty 2

## 2018-04-28 MED ORDER — OXYTOCIN 40 UNITS IN NORMAL SALINE INFUSION - SIMPLE MED
2.5000 [IU]/h | INTRAVENOUS | Status: DC
  Filled 2018-04-28: qty 1000

## 2018-04-28 MED ORDER — ACETAMINOPHEN 325 MG PO TABS: 650.0000 mg | ORAL_TABLET | ORAL | Status: DC | PRN

## 2018-04-28 MED ORDER — DIPHENHYDRAMINE HCL 25 MG PO CAPS: 25.0000 mg | ORAL_CAPSULE | Freq: Four times a day (QID) | ORAL | Status: DC | PRN

## 2018-04-28 MED ORDER — FLEET ENEMA 7-19 GM/118ML RE ENEM: 1.0000 | ENEMA | RECTAL | Status: DC | PRN

## 2018-04-28 MED ORDER — SENNOSIDES-DOCUSATE SODIUM 8.6-50 MG PO TABS
2.0000 | ORAL_TABLET | ORAL | Status: DC
  Administered 2018-04-29: 2 via ORAL
  Filled 2018-04-28 (×2): qty 2

## 2018-04-28 MED ORDER — SODIUM CHLORIDE 0.9 % IV SOLN
1.0000 g | INTRAVENOUS | Status: DC
  Administered 2018-04-28: 1 g via INTRAVENOUS
  Filled 2018-04-28 (×4): qty 1000

## 2018-04-28 MED ORDER — LACTATED RINGERS IV SOLN
500.0000 mL | INTRAVENOUS | Status: DC | PRN
  Administered 2018-04-28: 1000 mL via INTRAVENOUS

## 2018-04-28 MED ORDER — ZOLPIDEM TARTRATE 5 MG PO TABS: 5.0000 mg | ORAL_TABLET | Freq: Every evening | ORAL | Status: DC | PRN

## 2018-04-28 MED ORDER — DIPHENHYDRAMINE HCL 50 MG/ML IJ SOLN: 12.5000 mg | INTRAMUSCULAR | Status: DC | PRN

## 2018-04-28 MED ORDER — DIBUCAINE 1 % RE OINT: 1.0000 "application " | TOPICAL_OINTMENT | RECTAL | Status: DC | PRN

## 2018-04-28 MED ORDER — PHENYLEPHRINE 40 MCG/ML (10ML) SYRINGE FOR IV PUSH (FOR BLOOD PRESSURE SUPPORT): 80.0000 ug | PREFILLED_SYRINGE | INTRAVENOUS | Status: DC | PRN

## 2018-04-28 MED ORDER — WITCH HAZEL-GLYCERIN EX PADS: 1.0000 "application " | MEDICATED_PAD | CUTANEOUS | Status: DC | PRN

## 2018-04-28 MED ORDER — SODIUM CHLORIDE 0.9 % IV SOLN
2.0000 g | Freq: Once | INTRAVENOUS | Status: AC
  Administered 2018-04-28: 2 g via INTRAVENOUS
  Filled 2018-04-28: qty 2000

## 2018-04-28 MED ORDER — LIDOCAINE HCL (PF) 1 % IJ SOLN: 30.0000 mL | INTRAMUSCULAR | Status: DC | PRN

## 2018-04-28 MED ORDER — OXYCODONE-ACETAMINOPHEN 5-325 MG PO TABS: 2.0000 | ORAL_TABLET | ORAL | Status: DC | PRN

## 2018-04-28 MED ORDER — ONDANSETRON HCL 4 MG PO TABS: 4.0000 mg | ORAL_TABLET | ORAL | Status: DC | PRN

## 2018-04-28 MED ORDER — SOD CITRATE-CITRIC ACID 500-334 MG/5ML PO SOLN: 30.0000 mL | ORAL | Status: DC | PRN

## 2018-04-28 MED ORDER — ONDANSETRON HCL 4 MG/2ML IJ SOLN: 4.0000 mg | Freq: Four times a day (QID) | INTRAMUSCULAR | Status: DC | PRN

## 2018-04-28 MED ORDER — EPHEDRINE 5 MG/ML INJ
10.0000 mg | INTRAVENOUS | Status: DC | PRN
  Administered 2018-04-28: 10 mg via INTRAVENOUS
  Filled 2018-04-28: qty 10

## 2018-04-28 MED ORDER — PHENYLEPHRINE 40 MCG/ML (10ML) SYRINGE FOR IV PUSH (FOR BLOOD PRESSURE SUPPORT)
80.0000 ug | PREFILLED_SYRINGE | INTRAVENOUS | Status: DC | PRN
  Administered 2018-04-28 (×2): 80 ug via INTRAVENOUS
  Filled 2018-04-28: qty 10

## 2018-04-28 MED ORDER — LACTATED RINGERS IV SOLN
INTRAVENOUS | Status: DC
  Administered 2018-04-28 (×3): via INTRAVENOUS

## 2018-04-28 NOTE — Progress Notes (Signed)
Rachel Barrera is a 28 y.o. G2P1001 at [redacted]w[redacted]d admitted for active labor  Subjective: SROM clear. She is comfortable with epidural and feeling strong rectal pressure with contractions. I checked her at [phone] and noted she was 9cm. Peanut was placed and patient left in left lateral. On recheck, a left lateral lip was palpable. Patient repositioned to right lateral with peanut.   Objective: Vitals:   04/28/18 0802 04/28/18 0830 04/28/18 0926 04/28/18 0935  BP: 133/64 (!) 103/58 101/87   Pulse: (!) 105 96    Resp: 18 18 18 18   Temp:      TempSrc:      SpO2:      Weight:      Height:        FHT:  FHR: 140 bpm, variability: moderate,  accelerations:  Present,  decelerations:  Present early decels with occasional variable decels UC:   regular, every 2-3 minutes SVE:   Dilation: Lip/rim Effacement (%): 100 Station: -2 Exam by:: Waynette Buttery, cnm  Labs: Lab Results  Component Value Date   WBC 11.4 (H) 04/28/2018   HGB 12.3 04/28/2018   HCT 38.3 04/28/2018   MCV 91.8 04/28/2018   PLT 212 04/28/2018    Assessment / Plan: Spontaneous labor, progressing normally  Labor: Progressing normally Preeclampsia:  no signs or symptoms of toxicity, intake and ouput balanced and labs stable Fetal Wellbeing:  Category II but overall reassuring  Pain Control:  Epidural I/D:  n/a Anticipated MOD:  NSVD  Rachel Barrera 04/28/2018, 9:39 AM

## 2018-04-28 NOTE — Anesthesia Preprocedure Evaluation (Signed)
Anesthesia Evaluation  Patient identified by MRN, date of birth, ID band Patient awake    Reviewed: Allergy & Precautions, H&P , NPO status , Patient's Chart, lab work & pertinent test results  History of Anesthesia Complications Negative for: history of anesthetic complications  Airway Mallampati: II  TM Distance: >3 FB Neck ROM: full    Dental no notable dental hx.    Pulmonary neg pulmonary ROS, former smoker,    Pulmonary exam normal        Cardiovascular negative cardio ROS Normal cardiovascular exam Rhythm:regular Rate:Normal     Neuro/Psych negative neurological ROS  negative psych ROS   GI/Hepatic negative GI ROS, Neg liver ROS,   Endo/Other  negative endocrine ROS  Renal/GU negative Renal ROS  negative genitourinary   Musculoskeletal   Abdominal   Peds  Hematology negative hematology ROS (+)   Anesthesia Other Findings   Reproductive/Obstetrics (+) Pregnancy                             Anesthesia Physical Anesthesia Plan  ASA: II  Anesthesia Plan: Epidural   Post-op Pain Management:    Induction:   PONV Risk Score and Plan:   Airway Management Planned:   Additional Equipment:   Intra-op Plan:   Post-operative Plan:   Informed Consent: I have reviewed the patients History and Physical, chart, labs and discussed the procedure including the risks, benefits and alternatives for the proposed anesthesia with the patient or authorized representative who has indicated his/her understanding and acceptance.       Plan Discussed with:   Anesthesia Plan Comments:         Anesthesia Quick Evaluation  

## 2018-04-28 NOTE — Anesthesia Pain Management Evaluation Note (Signed)
  [facility] Pain Management Visit Note  Patient: Rachel Barrera, 28 y.o., female  "Hello I am a member of the anesthesia team at [facility]. We have an anesthesia team available at all times to provide care throughout the hospital, including epidural management and anesthesia for C-section. I don't know your plan for the delivery whether it a natural birth, water birth, IV sedation, nitrous supplementation, doula or epidural, but we want to meet your pain goals."   1.Was your pain managed to your expectations on prior hospitalizations?   Yes   2.What is your expectation for pain management during this hospitalization?     Epidural  3.How can we help you reach that goal? epidural  Record the patient's initial score and the patient's pain goal.   Pain: 9/10  Pain Goal: 1/10 [facility] wants you to be able to say your pain was always managed very well.  Salome Arnt 04/28/2018

## 2018-04-28 NOTE — Progress Notes (Signed)
Subjective: Comfortable with epidural Objective: BP 121/62   Pulse (!) 105   Temp 98.2 F (36.8 C) (Axillary)   Resp 20   Ht 5\' 5"  (1.651 m)   Wt 108 kg   SpO2 100%   BMI 39.62 kg/m  No intake/output data recorded. No intake/output data recorded.  FHT: Category 1 FHT 155 accel no decels UC:   regular, every 4 minutes SVE:   Dilation: 6 Effacement (%): 80 Station: -2 Exam by:: Annice Pih RN  Assessment:  IUP at 39.6 in active labor Cat 1 strip GBS positive Recurrent UTI on suppression  Plan: Will wait for second antibiotic before AROM Anticipate SVD  Kenney Houseman CNM, MSN 04/28/2018, 7:02 AM

## 2018-04-28 NOTE — Anesthesia Procedure Notes (Signed)
Epidural Patient location during procedure: OB Start time: 04/28/2018 4:59 AM End time: 04/28/2018 5:20 AM  Staffing Anesthesiologist: Lucretia Kern, MD Performed: anesthesiologist   Preanesthetic Checklist Completed: patient identified, pre-op evaluation, timeout performed, IV checked, risks and benefits discussed and monitors and equipment checked  Epidural Patient position: sitting Prep: DuraPrep Patient monitoring: heart rate, continuous pulse ox and blood pressure Approach: midline Location: L3-L4 Injection technique: LOR air  Needle:  Needle type: Tuohy  Needle gauge: 17 G Needle length: 9 cm Needle insertion depth: 8 cm Catheter type: closed end flexible Catheter size: 19 Gauge Catheter at skin depth: 13 cm Test dose: negative and 2% lidocaine with Epi 1:200 K  Assessment Events: blood not aspirated, injection not painful, no injection resistance, negative IV test and no paresthesia  Additional Notes Reason for block:procedure for pain

## 2018-04-28 NOTE — Anesthesia Postprocedure Evaluation (Signed)
Anesthesia Post Note  Patient: Rachel Barrera  Procedure(s) Performed: AN AD HOC LABOR EPIDURAL     Patient location during evaluation: Mother Baby Anesthesia Type: Epidural Level of consciousness: awake and alert Pain management: pain level controlled Vital Signs Assessment: post-procedure vital signs reviewed and stable Respiratory status: spontaneous breathing, nonlabored ventilation and respiratory function stable Cardiovascular status: stable Postop Assessment: no headache, no backache, epidural receding, no apparent nausea or vomiting, patient able to bend at knees, able to ambulate and adequate PO intake Anesthetic complications: no    Last Vitals:  Vitals:   04/28/18 1215 04/28/18 1315  BP: 140/77 116/68  Pulse: (!) 101 (!) 110  Resp: 18 16  Temp: 37.3 C 37.3 C  SpO2:      Last Pain:  Vitals:   04/28/18 1715  TempSrc:   PainSc: 0-No pain   Pain Goal:                   [employer]

## 2018-04-28 NOTE — Lactation Note (Signed)
This note was copied from a baby's chart. Lactation Consultation Note  Patient Name: Rachel Barrera [id number] Date: 04/28/2018 Reason for consult: Initial assessment;Term  7 hours old FT female who is still being exclusively BF by his mother, she's a P2. Mom came as breast/formula on her feeding choice on admission. She was able to BF her last child but she is not very experienced BF though, she only did it for 2 weeks and self reported a low milk supply. She also reported minimal breast changes during this pregnancy. She participated in the [facility] program at the [facility] and she's already familiar with hand expression, she has been able to obtain colostrum and spoon feed her baby with the help of her RN. Mom has a DEBP at home.  Offered assistance with latch but mom politely declined stating that baby already fed and he wasn't due for a feeding, baby was asleep and swaddled in visitor's arms. Asked mom to call for assistance when needed. Reviewed normal newborn behavior, feeding cues and cluster feeding.   Feeding plan:  1. Encouraged mom to feed baby STS 8-12 times/24 hours or sooner if feeding cues are present 2. Hand expression and spoon feeding was also encouraged.  BF brochure and feeding diary were reviewed. Mom reported all questions and concerns were answered, she's aware of LC services and will call PRN.  Maternal Data Formula Feeding for Exclusion: Yes Reason for exclusion: Mother's choice to formula and breast feed on admission Has patient been taught Hand Expression?: Yes Does the patient have breastfeeding experience prior to this delivery?: Yes  Feeding Feeding Type: Breast Fed    Interventions Interventions: Breast feeding basics reviewed  Lactation Tools Discussed/Used WIC Program: Yes   Consult Status Consult Status: Follow-up Date: 04/29/18 Follow-up type: In-patient    Ruthie Berch Venetia Constable 04/28/2018, 8:47 PM

## 2018-04-28 NOTE — H&P (Addendum)
Rachel Barrera is a 28 y.o. female, G2P1001 at 39.6 weeks, presenting for labor contractions.  FM+.  IBOW.  Prenatal hx remarkable for UTI recurrent treated with antibiotics for suppression.  Patient Active Problem List   Diagnosis Date Noted  . Normal labor 04/28/2018  . Epidermal cyst of face 11/25/2015  . 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   EDC of 04/29/2018 was established by LMP.   Anatomy scan:  22 weeks, with normal findings Additional [place] evaluations:  None Significant prenatal events:  None Last evaluation:  This week  OB History    Gravida  2   Para  1   Term  1   Preterm  0   AB  0   Living  1     SAB  0   TAB  0   Ectopic  0   Multiple  0   Live Births  1          Past Medical History:  Diagnosis Date  . Abnormal Pap smear 2010   cervical biopsy  . Vaginal Pap smear, abnormal    Past Surgical History:  Procedure Laterality Date  . NO PAST SURGERIES     Family History: family history includes Hypertension in her mother. Social History:  reports that she quit smoking about 4 years ago. Her smoking use included cigarettes. She has never used smokeless tobacco. She reports previous alcohol use. She reports that she does not use drugs.   Prenatal Transfer Tool  Maternal Diabetes: No Genetic Screening: Normal Maternal Ultrasounds/Referrals: Normal Fetal Ultrasounds or other Referrals:  None Maternal Substance Abuse:  No Significant Maternal Medications:  Meds include: Other: Macrobid for chronic UTI Significant Maternal Lab Results: None  TDAP UTD Flu UTD  ROS:  All 10 systems reviewed and negative except stated above.  No Known Allergies   Dilation: 6 Effacement (%): 80 Station: -2 Exam by:: Brynda Greathouse  Blood pressure 127/65, pulse 99, temperature 98.3 F (36.8 C), resp. rate 20, height 5\' 5"  (1.651 m), weight 108 kg, unknown if currently breastfeeding.  Chest  clear Heart RRR without murmur Abd gravid, NT, FH appropriate Pelvic: Per RN Ext: Neg +2/+2  FHR: Category 1 FHT BL 140 accels, no decels variability present UCs:  Every 4 to 5  Prenatal labs: ABO, Rh: --/--/PENDING (03/05 0403)O pos Antibody: PENDING (03/05 0403)Neg Rubella:   Imm RPR:   NR HBsAg:   Neg HIV:   NR GBS:   Sickle cell/Hgb electrophoresis:  AA Pap: Neg GC:  Neg Chlamydia:  Neg Genetic screenings:  Normal NIPT Glucola:  121 Other:   Hgb 11.5 at NOB, 11.9 at 28 weeks       Assessment/Plan: IUP at 39.6 in active labor Cat 1 strip GBS positive Recurrent UTI on suppression  Plan: Admit to Birthing Suite  Routine [facility] orders Pain med/epidural prn Ampicillin  for GBS prophylaxis due to advance dialation Anticipate SVD  Henderson Newcomer ProtheroCNM, MSN 04/28/2018, 5:26 AM

## 2018-04-28 NOTE — MAU Note (Signed)
Patient presents to MAU reporting of ctx every 5 minutes.  No LOF/VB.  + FM.  GBS +.  Was 3 cm on Tuesday.

## 2018-04-29 LAB — CBC
HCT: 33.5 % — ABNORMAL LOW (ref 36.0–46.0)
HEMOGLOBIN: 10.8 g/dL — AB (ref 12.0–15.0)
MCH: 29.6 pg (ref 26.0–34.0)
MCHC: 32.2 g/dL (ref 30.0–36.0)
MCV: 91.8 fL (ref 80.0–100.0)
Platelets: 180 10*3/uL (ref 150–400)
RBC: 3.65 MIL/uL — ABNORMAL LOW (ref 3.87–5.11)
RDW: 13.5 % (ref 11.5–15.5)
WBC: 10.3 10*3/uL (ref 4.0–10.5)
nRBC: 0 % (ref 0.0–0.2)

## 2018-04-29 NOTE — Progress Notes (Signed)
Subjective: Postpartum Day # 1 : S/P NSVD due to normal spontaneous labor and delivery. Pt in room alone, happy and bonding with infant. Patient up ad lib, denies syncope or dizziness. Reports consuming regular diet without issues and denies N/V. Patient reports 0 bowel movement + passing flatus.  Denies issues with urination and reports bleeding is "light."  Patient is breast and bottlefeeding and reports going well, stated her milk is not in and will see lactation today, education given.  Desires unknown for postpartum contraception.  Pain is being appropriately managed with use of po meds. Hgb went from 12.3 on admission to 10.8 post delivery, pt stable.   1st laceration Feeding:  Breast/bottle Contraceptive plan:  unknown BB: Circ Out pt desired  Objective: Vital signs in last 24 hours: Patient Vitals for the past 24 hrs:  BP Temp Temp src Pulse Resp  04/29/18 0611 122/84 (!) 97.5 F (36.4 C) Oral 87 18  04/28/18 2125 117/74 98.6 F (37 C) Oral 87 18  04/28/18 1715 139/83 98.9 F (37.2 C) Oral 90 18  04/28/18 1315 116/68 99.2 F (37.3 C) Oral (!) 110 16  04/28/18 1215 140/77 99.2 F (37.3 C) Oral (!) 101 18  04/28/18 1149 135/70 - - (!) 101 18  04/28/18 1116 121/74 98.8 F (37.1 C) Axillary (!) 116 18  04/28/18 1101 135/64 - - (!) 118 18  04/28/18 1046 137/67 - - (!) 112 18  04/28/18 1031 108/66 - - (!) 106 -  04/28/18 1024 (!) 118/58 - - (!) 123 -  04/28/18 0935 - - - - 18  04/28/18 0926 101/87 - - - 18  04/28/18 0830 (!) 103/58 - - 96 18  04/28/18 0802 133/64 - - (!) 105 18  04/28/18 0755 - 97.9 F (36.6 C) Oral - 20     Physical Exam:  General: alert, cooperative, appears stated age and no distress Mood/Affect: Happy Lungs: clear to auscultation, no wheezes, rales or rhonchi, symmetric air entry.  Heart: normal rate, regular rhythm, normal S1, S2, no murmurs, rubs, clicks or gallops. Breast: breasts appear normal, no suspicious masses, no skin or nipple changes or  axillary nodes. Abdomen:  + bowel sounds, soft, non-tender GU: perineum approximate, healing well. No signs of external hematomas.  Uterine Fundus: firm Lochia: appropriate Skin: Warm, Dry. DVT Evaluation: No evidence of DVT seen on physical exam. Negative Homan's sign. No cords or calf tenderness. No significant calf/ankle edema.  CBC Latest Ref Rng & Units 04/29/2018 04/28/2018 11/24/2014  WBC 4.0 - 10.5 K/uL 10.3 11.4(H) 19.7(H)  Hemoglobin 12.0 - 15.0 g/dL 10.8(L) 12.3 9.8(L)  Hematocrit 36.0 - 46.0 % 33.5(L) 38.3 30.3(L)  Platelets 150 - 400 K/uL 180 212 204    Results for orders placed or performed during the hospital encounter of 04/28/18 (from the past 24 hour(s))  CBC     Status: Abnormal   Collection Time: 04/29/18  6:00 AM  Result Value Ref Range   WBC 10.3 4.0 - 10.5 K/uL   RBC 3.65 (L) 3.87 - 5.11 MIL/uL   Hemoglobin 10.8 (L) 12.0 - 15.0 g/dL   HCT 32.4 (L) 40.1 - 02.7 %   MCV 91.8 80.0 - 100.0 fL   MCH 29.6 26.0 - 34.0 pg   MCHC 32.2 30.0 - 36.0 g/dL   RDW 25.3 66.4 - 40.3 %   Platelets 180 150 - 400 K/uL   nRBC 0.0 0.0 - 0.2 %     CBG (last 3)  No results for  input(s): GLUCAP in the last 72 hours.   I/O last 3 completed shifts: In: -  Out: 787 [Urine:500; Blood:287]   Assessment Postpartum Day # 1 : S/P NSVD due to normal spontaneous labor and delivery. Pt stable. -2 involution. Breast and bottle feeding. Hemodynamically stable. Hgb went from 12.3 on admission to 10.8 post delivery, pt stable. Declined early discharge, desire d/c to home tomorrow.   Plan: Continue other mgmt as ordered VTE prophylactics: Early ambulated as tolerates.  Pain control: Motrin/Tylenol PRN Education given regarding options for contraception, including barrier methods, injectable contraception, IUD placement, oral contraceptives.  Plan for discharge tomorrow, Breastfeeding, Lactation consult and Contraception unknwown for now, out pt circ desired for baby female.    Dr. Su Hilt to  be updated on patient status  [facility] NP-C, CNM 04/29/2018, 7:33 AM

## 2018-04-30 ENCOUNTER — Other Ambulatory Visit: Payer: Self-pay

## 2018-04-30 MED ORDER — IBUPROFEN 600 MG PO TABS: 600.0000 mg | ORAL_TABLET | Freq: Four times a day (QID) | ORAL | 0 refills | Status: DC

## 2018-04-30 NOTE — Lactation Note (Signed)
This note was copied from a baby's chart. Lactation Consultation Note:  Mother reports that she has slight sore nipples with the Rt nipple most sore.   Assist mother with placing infant in football on the left breast.  Mother taught to do off sided latch technique.  Infant latched on with good depth. Mother denies feeling any nipple pain.  Observed strong tugging with frequent swallows. Mother taught to watch infant for milk transfer.  Mother has a small child at home, discussed the challenges and gave mother tips . Mother to continue to cue base feed infant  And feed infant 8-12 times or more in 24 hours.  Discussed cluster feeding.  Mother has a DEBP at home and staff nurse Clydie Braun gave mother a hand pump.  Mother advised to limit pacifier use until she has a good milk supply. Discussed treatment and prevention of engorgement.   Mother advised to follow up with [facility] services at [facility].  Patient Name: Rachel Barrera [id number] Date: 04/30/2018 Reason for consult: Follow-up assessment   Maternal Data    Feeding Feeding Type: Breast Fed  LATCH Score Latch: Grasps breast easily, tongue down, lips flanged, rhythmical sucking.  Audible Swallowing: Spontaneous and intermittent  Type of Nipple: Everted at rest and after stimulation  Comfort (Breast/Nipple): Filling, red/small blisters or bruises, mild/mod discomfort  Hold (Positioning): Assistance needed to correctly position infant at breast and maintain latch.  LATCH Score: 8  Interventions Interventions: Skin to skin;Breast massage;Hand express;Hand pump  Lactation Tools Discussed/Used Tools: Pump(hand)   Consult Status Consult Status: Complete    Michel Bickers 04/30/2018, 10:53 AM

## 2018-04-30 NOTE — Discharge Summary (Signed)
OB Discharge Summary     Patient Name: Rachel Barrera DOB: 09-Sep-1990 MRN: [id number]  Date of admission: 04/28/2018 Delivering MD: Janeece Riggers   Date of discharge: 04/30/2018  Admitting diagnosis: 39 WKS, CTX Intrauterine pregnancy: [redacted]w[redacted]d     Secondary diagnosis:  Active Problems:   Normal labor  Additional problems: None     Discharge diagnosis: Term Pregnancy Delivered                                                                                                Post partum procedures:None  Augmentation: NA  Complications: None  Hospital course:  Onset of Labor With Vaginal Delivery     28 y.o. yo [id number] at [redacted]w[redacted]d was admitted in Active Labor on 04/28/2018. Patient had an uncomplicated labor course as follows:  Membrane Rupture Time/Date: 9:19 AM ,04/28/2018   Intrapartum Procedures: Episiotomy: None [1]                                         Lacerations:  1st degree [2];Perineal [11]  Patient had a delivery of a Viable infant. 04/28/2018  Information for the patient's newborn:  Leilene, Onufer [[id number]]  Delivery Method: Vag-Spont    Pateint had an uncomplicated postpartum course.  She is ambulating, tolerating a regular diet, passing flatus, and urinating well. Patient is discharged home in stable condition on 04/30/18.   Physical exam  Vitals:   04/29/18 0611 04/29/18 1506 04/29/18 2230 04/30/18 0608  BP: 122/84 114/73 114/63 113/62  Pulse: 87 79 79 70  Resp: 18 18 18 18   Temp: (!) 97.5 F (36.4 C) 98.5 F (36.9 C) 98.7 F (37.1 C) 97.8 F (36.6 C)  TempSrc: Oral Oral Oral Oral  SpO2:  100%    Weight:      Height:       General: alert, cooperative and no distress Lochia: appropriate Uterine Fundus: firm Incision: N/A DVT Evaluation: No evidence of DVT seen on physical exam. Labs: Lab Results  Component Value Date   WBC 10.3 04/29/2018   HGB 10.8 (L) 04/29/2018   HCT 33.5 (L) 04/29/2018   MCV 91.8 04/29/2018   PLT 180 04/29/2018   No flowsheet  data found.  Discharge instruction: per After Visit Summary and "Baby and Me Booklet".  After visit meds:  Allergies as of 04/30/2018   No Known Allergies     Medication List    STOP taking these medications   nitrofurantoin 100 MG capsule Commonly known as:  MACRODANTIN     TAKE these medications   Cetirizine HCl 10 MG Caps Take 1 capsule (10 mg total) by mouth daily. What changed:    when to take this  reasons to take this   ibuprofen 600 MG tablet Commonly known as:  ADVIL,MOTRIN Take 1 tablet (600 mg total) by mouth every 6 (six) hours.   prenatal multivitamin Tabs tablet Take 1 tablet by mouth daily at 12 noon.       Diet: routine diet  Activity: Advance as tolerated. Pelvic rest for 6 weeks.   Outpatient follow up:4 weeks Follow up Appt:No future appointments. Follow up Visit:No follow-ups on file.  Postpartum contraception: IUD Mirena  Newborn Data: Live born female  Birth Weight: 8 lb 6.2 oz (3805 g) APGAR: 8, 9  Newborn Delivery   Birth date/time:  04/28/2018 10:16:00 Delivery type:  Vaginal, Spontaneous     Baby Feeding: Bottle and Breast Disposition:home with mother   04/30/2018 Kenney Houseman, CNM

## 2018-05-06 ENCOUNTER — Inpatient Hospital Stay (HOSPITAL_COMMUNITY): Payer: Medicaid Other

## 2018-05-09 ENCOUNTER — Encounter (HOSPITAL_BASED_OUTPATIENT_CLINIC_OR_DEPARTMENT_OTHER): Payer: Self-pay | Admitting: *Deleted

## 2018-05-09 ENCOUNTER — Other Ambulatory Visit: Payer: Self-pay

## 2018-05-09 NOTE — H&P (Signed)
HPI:   Rachel Barrera is a 28 y.o. female who presents as a new Patient.   Referring Provider: Self, A Referral  Chief complaint: Infected cysts.  HPI: She has had a small cyst on her left cheek for several years. It gets infected periodically. She has another one on her left neck which appeared a few months ago and has also been infected. Both have been drained at urgent care. She is currently [redacted] weeks pregnant.  PMH/Meds/All/SocHx/FamHx/ROS:   Past Medical History:  Diagnosis Date  . Pregnancy   Past Surgical History:  Procedure Laterality Date  . MOUTH SURGERY   No family history of bleeding disorders, wound healing problems or difficulty with anesthesia.   Social History   Socioeconomic History  . Marital status: Not on file  Spouse name: Not on file  . Number of children: Not on file  . Years of education: Not on file  . Highest education level: Not on file  Occupational History  . Not on file  Social Needs  . Financial resource strain: Not on file  . Food insecurity:  Worry: Not on file  Inability: Not on file  . Transportation needs:  Medical: Not on file  Non-medical: Not on file  Tobacco Use  . Smoking status: Former Smoker  Packs/day: 0.00  . Smokeless tobacco: Never Used  Substance and Sexual Activity  . Alcohol use: Not on file  . Drug use: Not on file  . Sexual activity: Not on file  Lifestyle  . Physical activity:  Days per week: Not on file  Minutes per session: Not on file  . Stress: Not on file  Relationships  . Social connections:  Talks on phone: Not on file  Gets together: Not on file  Attends religious service: Not on file  Active member of club or organization: Not on file  Attends meetings of clubs or organizations: Not on file  Relationship status: Not on file  Other Topics Concern  . Not on file  Social History Narrative  . Not on file   Current Outpatient Medications:  . mupirocin (BACTROBAN) 2 % ointment, APPLY TO AFFECTED  AREA TWICE A DAY, Disp: , Rfl: 0 . prenatal vit calc,iron,folic (PRENATAL VITAMIN ORAL), Take by mouth., Disp: , Rfl:   A complete ROS was performed with pertinent positives/negatives noted in the HPI. The remainder of the ROS are negative.   Physical Exam:   BP 114/62 (Site: Left arm, Position: Sitting)  Ht 1.651 m (5\' 5" )  Wt 103.4 kg (228 lb)  BMI 37.94 kg/m   General: Healthy and alert, in no distress, breathing easily. Normal affect. In a pleasant mood. Head: Normocephalic, atraumatic. No masses, or scars. Eyes: Pupils are equal, and reactive to light. Vision is grossly intact. No spontaneous or gaze nystagmus. Ears: Ear canals are clear. Tympanic membranes are intact, with normal landmarks and the middle ears are clear and healthy. Hearing: Grossly normal. Nose: Nasal cavities are clear with healthy mucosa, no polyps or exudate. Airways are patent. Face: Epidermoid cyst, about 7 or 8 mm left cheek area; facial nerve function is symmetric. Oral Cavity: No mucosal abnormalities are noted. Tongue with normal mobility. Dentition appears healthy. Oropharynx: Tonsils are symmetric. There are no mucosal masses identified. Tongue base appears normal and healthy. Larynx/Hypopharynx: deferred Chest: Deferred Neck: 2 cm left neck epidermoid cyst present, no other palpable masses, no cervical adenopathy, no thyroid nodules or enlargement. Neuro: Cranial nerves II-XII with normal function. Balance: Normal gate. Other findings:  none.  Independent Review of Additional Tests or Records:  none  Procedures:  none  Impression & Plans:  Left cheek and left neck epidermoid cyst, recently infected and drained. Recommend surgical excision of both of these. This can be done under general anesthetic or local anesthetic with IV sedation, both at the outpatient surgical center. Recommend we wait until after the baby arrives for safety reasons. If she has any additional infection prior to that contact  me for antibiotics.

## 2018-05-16 ENCOUNTER — Ambulatory Visit (HOSPITAL_BASED_OUTPATIENT_CLINIC_OR_DEPARTMENT_OTHER): Admission: RE | Admit: 2018-05-16 | Payer: Medicaid Other | Source: Home / Self Care | Admitting: Otolaryngology

## 2018-05-16 SURGERY — EXCISION MASS
Anesthesia: Monitor Anesthesia Care

## 2018-07-05 ENCOUNTER — Encounter (HOSPITAL_BASED_OUTPATIENT_CLINIC_OR_DEPARTMENT_OTHER): Payer: Self-pay | Admitting: *Deleted

## 2018-07-05 ENCOUNTER — Other Ambulatory Visit: Payer: Self-pay

## 2018-07-07 ENCOUNTER — Other Ambulatory Visit: Payer: Self-pay

## 2018-07-07 ENCOUNTER — Other Ambulatory Visit (HOSPITAL_COMMUNITY)
Admission: RE | Admit: 2018-07-07 | Discharge: 2018-07-07 | Disposition: A | Discharge status: Home / Self Care | Payer: Medicaid Other | Source: Ambulatory Visit | Attending: Otolaryngology | Admitting: Otolaryngology

## 2018-07-07 DIAGNOSIS — Z87891 Personal history of nicotine dependence: Secondary | ICD-10-CM | POA: Diagnosis not present

## 2018-07-07 DIAGNOSIS — O26892 Other specified pregnancy related conditions, second trimester: Secondary | ICD-10-CM | POA: Diagnosis present

## 2018-07-07 DIAGNOSIS — L728 Other follicular cysts of the skin and subcutaneous tissue: Secondary | ICD-10-CM | POA: Diagnosis not present

## 2018-07-07 DIAGNOSIS — Z1159 Encounter for screening for other viral diseases: Secondary | ICD-10-CM | POA: Diagnosis not present

## 2018-07-07 DIAGNOSIS — Z3A24 24 weeks gestation of pregnancy: Secondary | ICD-10-CM | POA: Diagnosis not present

## 2018-07-07 NOTE — H&P (Signed)
HPI:    Rachel Barrera is a 28 y.o. female who presents as a new  Patient.   Referring Provider: Self, A Referral   Chief complaint: Infected cysts.  HPI: She has had a small cyst on her left cheek for several years.  It gets infected periodically.  She has another one on her left neck which appeared a few months ago and has also been infected.  Both have been drained at urgent care.  She is currently [redacted] weeks pregnant.  PMH/Meds/All/SocHx/FamHx/ROS:   Past Medical History      Past Medical History:  Diagnosis Date  . Pregnancy       Past Surgical History       Past Surgical History:  Procedure Laterality Date  . MOUTH SURGERY        No family history of bleeding disorders, wound healing problems or difficulty with anesthesia.   Social History  Social History        Socioeconomic History  . Marital status: Not on file    Spouse name: Not on file  . Number of children: Not on file  . Years of education: Not on file  . Highest education level: Not on file  Occupational History  . Not on file  Social Needs  . Financial resource strain: Not on file  . Food insecurity:    Worry: Not on file    Inability: Not on file  . Transportation needs:    Medical: Not on file    Non-medical: Not on file  Tobacco Use  . Smoking status: Former Smoker    Packs/day: 0.00  . Smokeless tobacco: Never Used  Substance and Sexual Activity  . Alcohol use: Not on file  . Drug use: Not on file  . Sexual activity: Not on file  Lifestyle  . Physical activity:    Days per week: Not on file    Minutes per session: Not on file  . Stress: Not on file  Relationships  . Social connections:    Talks on phone: Not on file    Gets together: Not on file    Attends religious service: Not on file    Active member of club or organization: Not on file    Attends meetings of clubs or organizations: Not on file    Relationship status: Not on file   Other Topics Concern  . Not on file  Social History Narrative  . Not on file       Current Outpatient Medications:  .  mupirocin (BACTROBAN) 2 % ointment, APPLY TO AFFECTED AREA TWICE A DAY, Disp: , Rfl: 0 .  prenatal vit calc,iron,folic (PRENATAL VITAMIN ORAL), Take by mouth., Disp: , Rfl:   A complete ROS was performed with pertinent positives/negatives noted in the HPI. The remainder of the ROS are negative.    Physical Exam:    BP 114/62 (Site: Left arm, Position: Sitting)   Ht 1.651 m (5\' 5" )   Wt 103.4 kg (228 lb)   BMI 37.94 kg/m    General:  Healthy and alert, in no distress, breathing easily. Normal affect. In a pleasant mood. Head: Normocephalic, atraumatic. No masses, or scars. Eyes: Pupils are equal, and reactive to light. Vision is grossly intact. No spontaneous or gaze nystagmus. Ears: Ear canals are clear. Tympanic membranes are intact, with normal landmarks and the middle ears are clear and healthy. Hearing: Grossly normal. Nose: Nasal cavities are clear with healthy mucosa, no polyps or exudate. Airways are patent.  Face: Epidermoid cyst, about 7 or 8 mm left cheek area; facial nerve function is symmetric. Oral Cavity: No mucosal abnormalities are noted. Tongue with normal mobility. Dentition appears healthy. Oropharynx: Tonsils are symmetric. There are no mucosal masses identified. Tongue base appears normal and healthy. Larynx/Hypopharynx: deferred Chest: Deferred Neck: 2 cm left neck epidermoid cyst present, no other palpable masses, no cervical adenopathy, no thyroid nodules or enlargement. Neuro: Cranial nerves II-XII with normal function. Balance: Normal gate. Other findings: none.   Independent Review of Additional Tests or Records:  none  Procedures:  none   Impression & Plans:  Left cheek and left neck epidermoid cyst, recently infected and drained.  Recommend surgical excision of both of these.  This can be done under general  anesthetic or local anesthetic with IV sedation, both at the outpatient surgical center.  Recommend we wait until after the baby arrives for safety reasons.  If she has any additional infection prior to that contact me for antibiotics.

## 2018-07-08 LAB — NOVEL CORONAVIRUS, NAA (HOSP ORDER, SEND-OUT TO REF LAB; TAT 18-24 HRS): SARS-CoV-2, NAA: NOT DETECTED

## 2018-07-11 ENCOUNTER — Encounter (HOSPITAL_BASED_OUTPATIENT_CLINIC_OR_DEPARTMENT_OTHER): Payer: Self-pay | Admitting: *Deleted

## 2018-07-11 ENCOUNTER — Ambulatory Visit (HOSPITAL_BASED_OUTPATIENT_CLINIC_OR_DEPARTMENT_OTHER): Payer: Medicaid Other | Admitting: Anesthesiology

## 2018-07-11 ENCOUNTER — Ambulatory Visit (HOSPITAL_BASED_OUTPATIENT_CLINIC_OR_DEPARTMENT_OTHER)
Admission: RE | Admit: 2018-07-11 | Discharge: 2018-07-11 | Disposition: A | Discharge status: Home / Self Care | Payer: Medicaid Other | Attending: Otolaryngology | Admitting: Otolaryngology

## 2018-07-11 ENCOUNTER — Encounter (HOSPITAL_BASED_OUTPATIENT_CLINIC_OR_DEPARTMENT_OTHER)
Admission: RE | Disposition: A | Discharge status: Home / Self Care | Payer: Self-pay | Source: Home / Self Care | Attending: Otolaryngology

## 2018-07-11 ENCOUNTER — Other Ambulatory Visit: Payer: Self-pay

## 2018-07-11 DIAGNOSIS — L728 Other follicular cysts of the skin and subcutaneous tissue: Secondary | ICD-10-CM | POA: Insufficient documentation

## 2018-07-11 DIAGNOSIS — Z1159 Encounter for screening for other viral diseases: Secondary | ICD-10-CM | POA: Insufficient documentation

## 2018-07-11 DIAGNOSIS — Z3A24 24 weeks gestation of pregnancy: Secondary | ICD-10-CM | POA: Insufficient documentation

## 2018-07-11 DIAGNOSIS — Z87891 Personal history of nicotine dependence: Secondary | ICD-10-CM | POA: Insufficient documentation

## 2018-07-11 DIAGNOSIS — O26892 Other specified pregnancy related conditions, second trimester: Secondary | ICD-10-CM | POA: Diagnosis not present

## 2018-07-11 HISTORY — PX: CYST EXCISION: SHX5701

## 2018-07-11 HISTORY — DX: Local infection of the skin and subcutaneous tissue, unspecified: L08.9

## 2018-07-11 LAB — POCT PREGNANCY, URINE: Preg Test, Ur: NEGATIVE

## 2018-07-11 SURGERY — CYST REMOVAL
Anesthesia: General | Laterality: Left

## 2018-07-11 MED ORDER — LIDOCAINE-EPINEPHRINE 1 %-1:100000 IJ SOLN
INTRAMUSCULAR | Status: DC | PRN
  Administered 2018-07-11: 3 mL

## 2018-07-11 MED ORDER — MIDAZOLAM HCL 2 MG/2ML IJ SOLN
1.0000 mg | INTRAMUSCULAR | Status: DC | PRN
  Administered 2018-07-11: 2 mg via INTRAVENOUS

## 2018-07-11 MED ORDER — LIDOCAINE-EPINEPHRINE 1 %-1:100000 IJ SOLN
INTRAMUSCULAR | Status: AC
  Filled 2018-07-11: qty 1

## 2018-07-11 MED ORDER — ONDANSETRON HCL 4 MG/2ML IJ SOLN
INTRAMUSCULAR | Status: AC
  Filled 2018-07-11: qty 2

## 2018-07-11 MED ORDER — PROPOFOL 10 MG/ML IV BOLUS
INTRAVENOUS | Status: DC | PRN
  Administered 2018-07-11: 200 mg via INTRAVENOUS

## 2018-07-11 MED ORDER — LACTATED RINGERS IV SOLN
INTRAVENOUS | Status: DC
  Administered 2018-07-11: 08:00:00 via INTRAVENOUS

## 2018-07-11 MED ORDER — DEXAMETHASONE SODIUM PHOSPHATE 10 MG/ML IJ SOLN
INTRAMUSCULAR | Status: AC
  Filled 2018-07-11: qty 1

## 2018-07-11 MED ORDER — EPHEDRINE SULFATE 50 MG/ML IJ SOLN
INTRAMUSCULAR | Status: DC | PRN
  Administered 2018-07-11 (×2): 10 mg via INTRAVENOUS

## 2018-07-11 MED ORDER — SUCCINYLCHOLINE CHLORIDE 200 MG/10ML IV SOSY
PREFILLED_SYRINGE | INTRAVENOUS | Status: AC
  Filled 2018-07-11: qty 10

## 2018-07-11 MED ORDER — LIDOCAINE HCL (CARDIAC) PF 100 MG/5ML IV SOSY
PREFILLED_SYRINGE | INTRAVENOUS | Status: DC | PRN
  Administered 2018-07-11: 100 mg via INTRAVENOUS

## 2018-07-11 MED ORDER — FENTANYL CITRATE (PF) 100 MCG/2ML IJ SOLN
INTRAMUSCULAR | Status: AC
  Filled 2018-07-11: qty 2

## 2018-07-11 MED ORDER — CEPHALEXIN 500 MG PO CAPS: 500.0000 mg | ORAL_CAPSULE | Freq: Three times a day (TID) | ORAL | 0 refills | Status: AC

## 2018-07-11 MED ORDER — ONDANSETRON HCL 4 MG/2ML IJ SOLN
INTRAMUSCULAR | Status: DC | PRN
  Administered 2018-07-11: 4 mg via INTRAVENOUS

## 2018-07-11 MED ORDER — EPHEDRINE 5 MG/ML INJ
INTRAVENOUS | Status: AC
  Filled 2018-07-11: qty 10

## 2018-07-11 MED ORDER — LIDOCAINE 2% (20 MG/ML) 5 ML SYRINGE
INTRAMUSCULAR | Status: AC
  Filled 2018-07-11: qty 5

## 2018-07-11 MED ORDER — DEXAMETHASONE SODIUM PHOSPHATE 4 MG/ML IJ SOLN
INTRAMUSCULAR | Status: DC | PRN
  Administered 2018-07-11: 10 mg via INTRAVENOUS

## 2018-07-11 MED ORDER — MIDAZOLAM HCL 2 MG/2ML IJ SOLN
INTRAMUSCULAR | Status: AC
  Filled 2018-07-11: qty 2

## 2018-07-11 MED ORDER — FENTANYL CITRATE (PF) 100 MCG/2ML IJ SOLN
50.0000 ug | INTRAMUSCULAR | Status: AC | PRN
  Administered 2018-07-11: 50 ug via INTRAVENOUS
  Administered 2018-07-11 (×2): 25 ug via INTRAVENOUS

## 2018-07-11 MED ORDER — PROPOFOL 10 MG/ML IV BOLUS
INTRAVENOUS | Status: AC
  Filled 2018-07-11: qty 20

## 2018-07-11 MED ORDER — ATROPINE SULFATE 0.4 MG/ML IJ SOLN
INTRAMUSCULAR | Status: AC
  Filled 2018-07-11: qty 1

## 2018-07-11 MED ORDER — SCOPOLAMINE 1 MG/3DAYS TD PT72: 1.0000 | MEDICATED_PATCH | Freq: Once | TRANSDERMAL | Status: DC | PRN

## 2018-07-11 SURGICAL SUPPLY — 55 items
ATTRACTOMAT 16X20 MAGNETIC DRP (DRAPES) IMPLANT
BENZOIN TINCTURE PRP APPL 2/3 (GAUZE/BANDAGES/DRESSINGS) IMPLANT
BLADE SURG 15 STRL LF DISP TIS (BLADE) ×1 IMPLANT
BLADE SURG 15 STRL SS (BLADE) ×2
CANISTER SUCT 1200ML W/VALVE (MISCELLANEOUS) ×3 IMPLANT
CLEANER CAUTERY TIP 5X5 PAD (MISCELLANEOUS) ×1 IMPLANT
CLOSURE WOUND 1/2 X4 (GAUZE/BANDAGES/DRESSINGS)
CORD BIPOLAR FORCEPS 12FT (ELECTRODE) IMPLANT
COVER BACK TABLE REUSABLE LG (DRAPES) ×3 IMPLANT
COVER MAYO STAND REUSABLE (DRAPES) ×3 IMPLANT
COVER WAND RF STERILE (DRAPES) IMPLANT
DERMABOND ADVANCED (GAUZE/BANDAGES/DRESSINGS) ×2
DERMABOND ADVANCED .7 DNX12 (GAUZE/BANDAGES/DRESSINGS) ×1 IMPLANT
DRAIN JACKSON RD 7FR 3/32 (WOUND CARE) IMPLANT
DRAIN PENROSE 1/4X12 LTX STRL (WOUND CARE) IMPLANT
DRAPE U-SHAPE 76X120 STRL (DRAPES) ×3 IMPLANT
ELECT COATED BLADE 2.86 ST (ELECTRODE) ×3 IMPLANT
ELECT REM PT RETURN 9FT ADLT (ELECTROSURGICAL) ×3
ELECTRODE REM PT RTRN 9FT ADLT (ELECTROSURGICAL) ×1 IMPLANT
GAUZE 4X4 16PLY RFD (DISPOSABLE) IMPLANT
GAUZE SPONGE 4X4 12PLY STRL LF (GAUZE/BANDAGES/DRESSINGS) IMPLANT
GLOVE ECLIPSE 7.5 STRL STRAW (GLOVE) ×3 IMPLANT
GOWN STRL REUS W/ TWL LRG LVL3 (GOWN DISPOSABLE) ×2 IMPLANT
GOWN STRL REUS W/TWL LRG LVL3 (GOWN DISPOSABLE) ×4
NEEDLE HYPO 25X1 1.5 SAFETY (NEEDLE) IMPLANT
NEEDLE HYPO 30X.5 LL (NEEDLE) ×3 IMPLANT
NEEDLE PRECISIONGLIDE 27X1.5 (NEEDLE) ×3 IMPLANT
NS IRRIG 1000ML POUR BTL (IV SOLUTION) IMPLANT
PACK BASIN DAY SURGERY FS (CUSTOM PROCEDURE TRAY) ×3 IMPLANT
PAD CLEANER CAUTERY TIP 5X5 (MISCELLANEOUS) ×2
PENCIL FOOT CONTROL (ELECTRODE) ×3 IMPLANT
RUBBERBAND STERILE (MISCELLANEOUS) IMPLANT
SHEET MEDIUM DRAPE 40X70 STRL (DRAPES) IMPLANT
SLEEVE SCD COMPRESS KNEE MED (MISCELLANEOUS) ×3 IMPLANT
SPONGE GAUZE 2X2 8PLY STER LF (GAUZE/BANDAGES/DRESSINGS)
SPONGE GAUZE 2X2 8PLY STRL LF (GAUZE/BANDAGES/DRESSINGS) IMPLANT
STAPLER VISISTAT 35W (STAPLE) IMPLANT
STRIP CLOSURE SKIN 1/2X4 (GAUZE/BANDAGES/DRESSINGS) IMPLANT
SUCTION FRAZIER HANDLE 10FR (MISCELLANEOUS)
SUCTION TUBE FRAZIER 10FR DISP (MISCELLANEOUS) IMPLANT
SUT CHROMIC 3 0 PS 2 (SUTURE) ×3 IMPLANT
SUT CHROMIC 4 0 P 3 18 (SUTURE) ×3 IMPLANT
SUT ETHILON 4 0 PS 2 18 (SUTURE) IMPLANT
SUT ETHILON 5 0 P 3 18 (SUTURE)
SUT ETHILON 6 0 P 1 (SUTURE) IMPLANT
SUT NYLON ETHILON 5-0 P-3 1X18 (SUTURE) IMPLANT
SUT PLAIN 5 0 P 3 18 (SUTURE) IMPLANT
SUT SILK 4 0 TIES 17X18 (SUTURE) IMPLANT
SUT VICRYL 4-0 PS2 18IN ABS (SUTURE) IMPLANT
SYR BULB 3OZ (MISCELLANEOUS) IMPLANT
SYR CONTROL 10ML LL (SYRINGE) ×3 IMPLANT
TOWEL GREEN STERILE FF (TOWEL DISPOSABLE) ×3 IMPLANT
TRAY DSU PREP LF (CUSTOM PROCEDURE TRAY) ×3 IMPLANT
TUBE CONNECTING 20'X1/4 (TUBING) ×1
TUBE CONNECTING 20X1/4 (TUBING) ×2 IMPLANT

## 2018-07-11 NOTE — Op Note (Signed)
OPERATIVE REPORT  DATE OF SURGERY: 07/11/2018  PATIENT:  Rachel Barrera,  28 y.o. female  PRE-OPERATIVE DIAGNOSIS:  neck and facial Cyst x2 POST-OPERATIVE DIAGNOSIS:  neck and facial Cyst x2  PROCEDURE:  Procedure(s): Excision of neck and facial CYST REMOVAL  SURGEON:  Susy Frizzle, MD  ASSISTANTS: None  ANESTHESIA:   General   EBL: 30 ml  DRAINS: None  LOCAL MEDICATIONS USED: 1% Xylocaine with epinephrine  SPECIMEN: Left facial skin cyst, left neck skin cyst  COUNTS:  Correct  PROCEDURE DETAILS: The patient was taken to the operating room and placed on the operating table in the supine position. Following induction of general endotracheal anesthesia, the left face and neck were prepped and draped in a standard fashion.  The lesions were outlined with a marking pen.  On the face/cheek area, there was a scar with an underlying nodular region approximately 2 cm in greatest dimension.  On the neck there was a similar finding this time measuring approximately 8 cm in length.  Incisions were outlined with a marking pen on both lesions.  Local anesthetic was infiltrated into both lesions.  A #15 scalpel was used to incise the skin upper and lower on both lesions and subcutaneous dissection was accomplished to remove the entire abnormality.  Facial nerve branches were not encountered in the cheek area.  A small region of platysma was taken in the neck lesion as the lesion was felt to be adhering to the platysma.  Underlying the platysma there was stimulation of the marginal mandibular branch but the branch itself was not visualized as it was felt to be deep to the level of dissection.  Electrocautery was used to complete hemostasis.  Both lesions were reapproximated in layers using 4-0 chromic on the upper cheek lesion and 3-0 chromic on the neck lesion.  Subcuticular running closure with chromic was then used.  Dermabond was used on the skin.  Both specimens were sent for pathologic evaluation.   Patient was awakened extubated and transferred to recovery in stable condition.    PATIENT DISPOSITION:  To PACU, stable

## 2018-07-11 NOTE — Anesthesia Procedure Notes (Signed)
Procedure Name: LMA Insertion Date/Time: 07/11/2018 7:58 AM Performed by: Gar Gibbon, CRNA Pre-anesthesia Checklist: Patient identified, Emergency Drugs available, Suction available and Patient being monitored Patient Re-evaluated:Patient Re-evaluated prior to induction Oxygen Delivery Method: Circle system utilized Preoxygenation: Pre-oxygenation with 100% oxygen Induction Type: IV induction Ventilation: Mask ventilation without difficulty LMA: LMA flexible inserted LMA Size: 4.0 Number of attempts: 1 Airway Equipment and Method: Bite block Placement Confirmation: positive ETCO2 Tube secured with: Tape Dental Injury: Teeth and Oropharynx as per pre-operative assessment

## 2018-07-11 NOTE — Anesthesia Postprocedure Evaluation (Signed)
Anesthesia Post Note  Patient: Rachel Barrera  Procedure(s) Performed: Excision of neck and facial CYST REMOVAL (Left )     Patient location during evaluation: PACU Anesthesia Type: General Level of consciousness: awake and alert Pain management: pain level controlled Vital Signs Assessment: post-procedure vital signs reviewed and stable Respiratory status: spontaneous breathing, nonlabored ventilation, respiratory function stable and patient connected to nasal cannula oxygen Cardiovascular status: blood pressure returned to baseline and stable Postop Assessment: no apparent nausea or vomiting Anesthetic complications: no    Last Vitals:  Vitals:   07/11/18 0900 07/11/18 0915  BP: 128/79 135/79  Pulse: 68 67  Resp: (!) 21 18  Temp:  37 C  SpO2: 96% 100%    Last Pain:  Vitals:   07/11/18 0915  TempSrc:   PainSc: 0-No pain                 Phillips Grout

## 2018-07-11 NOTE — Transfer of Care (Signed)
Immediate Anesthesia Transfer of Care Note  Patient: Rachel Barrera  Procedure(s) Performed: Excision of neck and facial CYST REMOVAL (Left )  Patient Location: PACU  Anesthesia Type:General  Level of Consciousness: awake, sedated and patient cooperative  Airway & Oxygen Therapy: Patient Spontanous Breathing  Post-op Assessment: Report given to RN and Post -op Vital signs reviewed and stable  Post vital signs: Reviewed and stable  Last Vitals:  Vitals Value Taken Time  BP    Temp    Pulse    Resp    SpO2      Last Pain:  Vitals:   07/11/18 0738  TempSrc: Oral  PainSc: 0-No pain         Complications: No apparent anesthesia complications

## 2018-07-11 NOTE — Anesthesia Preprocedure Evaluation (Signed)
Anesthesia Evaluation  Patient identified by MRN, date of birth, ID band Patient awake    Reviewed: Allergy & Precautions, NPO status , Patient's Chart, lab work & pertinent test results  Airway Mallampati: II  TM Distance: >3 FB Neck ROM: Full    Dental no notable dental hx.    Pulmonary neg pulmonary ROS, former smoker,    Pulmonary exam normal breath sounds clear to auscultation       Cardiovascular negative cardio ROS Normal cardiovascular exam Rhythm:Regular Rate:Normal     Neuro/Psych negative neurological ROS  negative psych ROS   GI/Hepatic negative GI ROS, Neg liver ROS,   Endo/Other  negative endocrine ROS  Renal/GU negative Renal ROS  negative genitourinary   Musculoskeletal negative musculoskeletal ROS (+)   Abdominal   Peds negative pediatric ROS (+)  Hematology negative hematology ROS (+)   Anesthesia Other Findings   Reproductive/Obstetrics negative OB ROS                             Anesthesia Physical Anesthesia Plan  ASA: II  Anesthesia Plan: General   Post-op Pain Management:    Induction: Intravenous  PONV Risk Score and Plan: 3 and Ondansetron  Airway Management Planned: LMA  Additional Equipment:   Intra-op Plan:   Post-operative Plan: Extubation in OR  Informed Consent: I have reviewed the patients History and Physical, chart, labs and discussed the procedure including the risks, benefits and alternatives for the proposed anesthesia with the patient or authorized representative who has indicated his/her understanding and acceptance.     Dental advisory given  Plan Discussed with: CRNA  Anesthesia Plan Comments:         Anesthesia Quick Evaluation

## 2018-07-11 NOTE — Discharge Instructions (Signed)
You may shower and use soap and water. Do not use any creams, oils or ointment. ° °Post Anesthesia Home Care Instructions ° °Activity: °Get plenty of rest for the remainder of the day. A responsible individual must stay with you for 24 hours following the procedure.  °For the next 24 hours, DO NOT: °-Drive a car °-Operate machinery °-Drink alcoholic beverages °-Take any medication unless instructed by your physician °-Make any legal decisions or sign important papers. ° °Meals: °Start with liquid foods such as gelatin or soup. Progress to regular foods as tolerated. Avoid greasy, spicy, heavy foods. If nausea and/or vomiting occur, drink only clear liquids until the nausea and/or vomiting subsides. Call your physician if vomiting continues. ° °Special Instructions/Symptoms: °Your throat may feel dry or sore from the anesthesia or the breathing tube placed in your throat during surgery. If this causes discomfort, gargle with warm salt water. The discomfort should disappear within 24 hours. ° °If you had a scopolamine patch placed behind your ear for the management of post- operative nausea and/or vomiting: ° °1. The medication in the patch is effective for 72 hours, after which it should be removed.  Wrap patch in a tissue and discard in the trash. Wash hands thoroughly with soap and water. °2. You may remove the patch earlier than 72 hours if you experience unpleasant side effects which may include dry mouth, dizziness or visual disturbances. °3. Avoid touching the patch. Wash your hands with soap and water after contact with the patch. °   ° °

## 2018-07-11 NOTE — Interval H&P Note (Signed)
History and Physical Interval Note:  07/11/2018 7:36 AM  Rachel Barrera  has presented today for surgery, with the diagnosis of neck and facial Cyst.  The various methods of treatment have been discussed with the patient and family. After consideration of risks, benefits and other options for treatment, the patient has consented to  Procedure(s): Excision of neck and facial CYST REMOVAL (N/A) as a surgical intervention.  The patient's history has been reviewed, patient examined, no change in status, stable for surgery.  I have reviewed the patient's chart and labs.  Questions were answered to the patient's satisfaction.     Serena Colonel

## 2018-07-12 ENCOUNTER — Encounter (HOSPITAL_BASED_OUTPATIENT_CLINIC_OR_DEPARTMENT_OTHER): Payer: Self-pay | Admitting: Otolaryngology
